# Patient Record
Sex: Male | Born: 1965
Health system: Southern US, Community
[De-identification: ages and names within clinical notes are randomized; demographics above are authoritative.]

## PROBLEM LIST (undated history)

## (undated) HISTORY — PX: NO PAST SURGERIES: SHX2092

---

## 2000-05-29 ENCOUNTER — Emergency Department (HOSPITAL_COMMUNITY): Admission: EM | Admit: 2000-05-29 | Discharge: 2000-05-29 | Payer: Self-pay | Admitting: Emergency Medicine

## 2000-06-02 ENCOUNTER — Encounter: Payer: Self-pay | Admitting: Otolaryngology

## 2000-06-02 ENCOUNTER — Encounter: Admission: RE | Admit: 2000-06-02 | Discharge: 2000-06-02 | Payer: Self-pay | Admitting: Otolaryngology

## 2003-10-29 ENCOUNTER — Inpatient Hospital Stay (HOSPITAL_COMMUNITY): Admission: AD | Admit: 2003-10-29 | Discharge: 2003-10-30 | Payer: Self-pay | Admitting: Otolaryngology

## 2011-06-06 ENCOUNTER — Emergency Department (HOSPITAL_COMMUNITY)
Admission: EM | Admit: 2011-06-06 | Discharge: 2011-06-06 | Disposition: A | Discharge status: Home / Self Care | Payer: Self-pay | Attending: Emergency Medicine | Admitting: Emergency Medicine

## 2011-06-06 ENCOUNTER — Emergency Department (HOSPITAL_COMMUNITY): Payer: Self-pay

## 2011-06-06 DIAGNOSIS — M7989 Other specified soft tissue disorders: Secondary | ICD-10-CM | POA: Insufficient documentation

## 2011-06-06 DIAGNOSIS — Y9269 Other specified industrial and construction area as the place of occurrence of the external cause: Secondary | ICD-10-CM | POA: Insufficient documentation

## 2011-06-06 DIAGNOSIS — W319XXA Contact with unspecified machinery, initial encounter: Secondary | ICD-10-CM | POA: Insufficient documentation

## 2011-06-06 DIAGNOSIS — S62309A Unspecified fracture of unspecified metacarpal bone, initial encounter for closed fracture: Secondary | ICD-10-CM | POA: Insufficient documentation

## 2011-06-06 DIAGNOSIS — Y990 Civilian activity done for income or pay: Secondary | ICD-10-CM | POA: Insufficient documentation

## 2013-03-09 IMAGING — CR DG HAND COMPLETE 3+V*R*
3 series · 3 of 3 positions shown · non-contrast
Comparison: None.

CLINICAL DATA: Right hand injury with pain.

RIGHT HAND - COMPLETE 3+ VIEW

[x hand pa left]
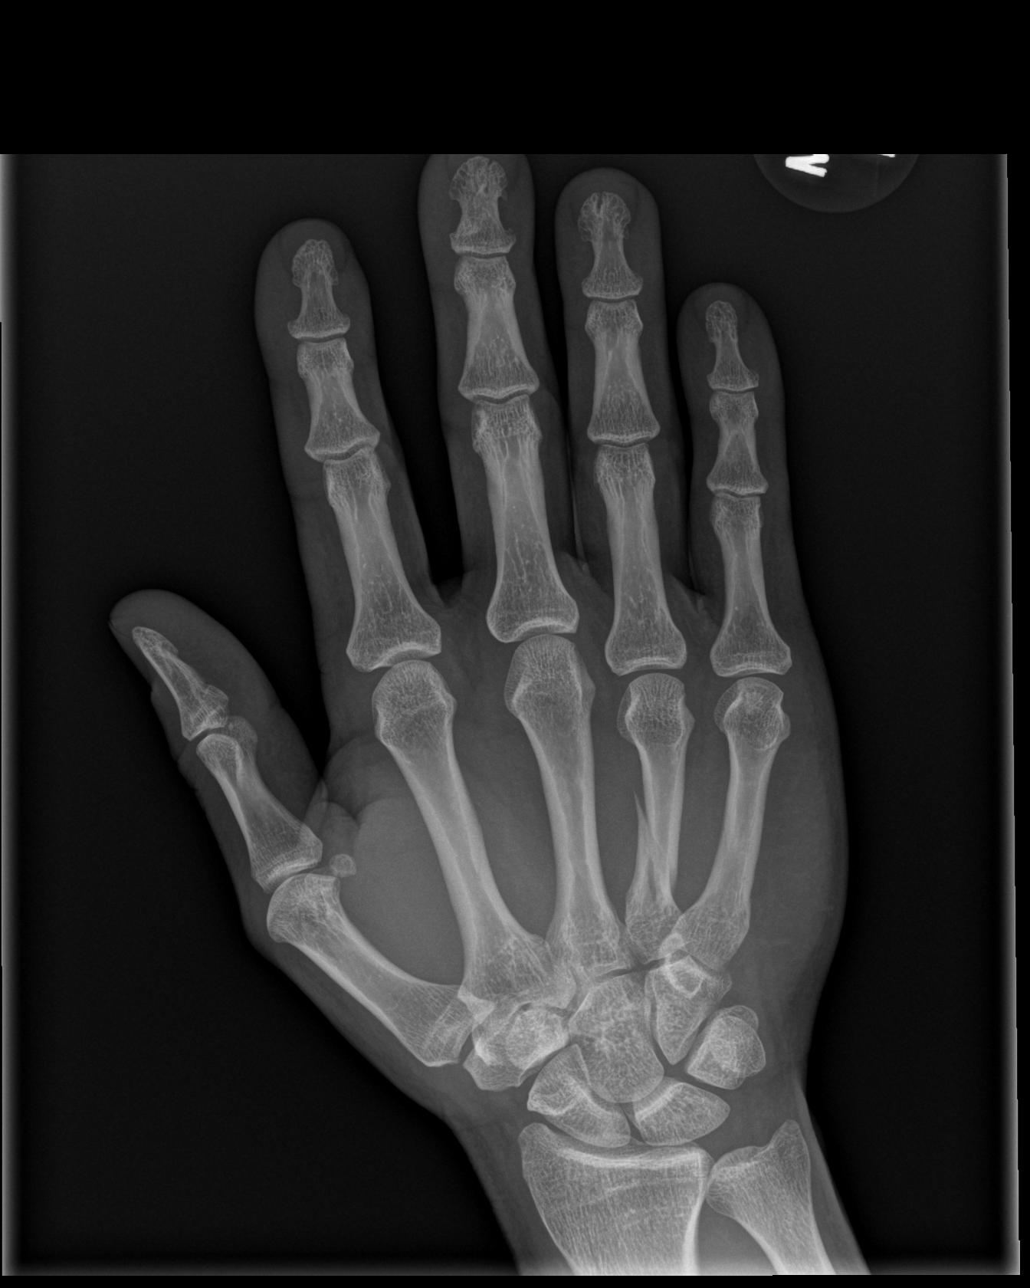

[x hand obl left]
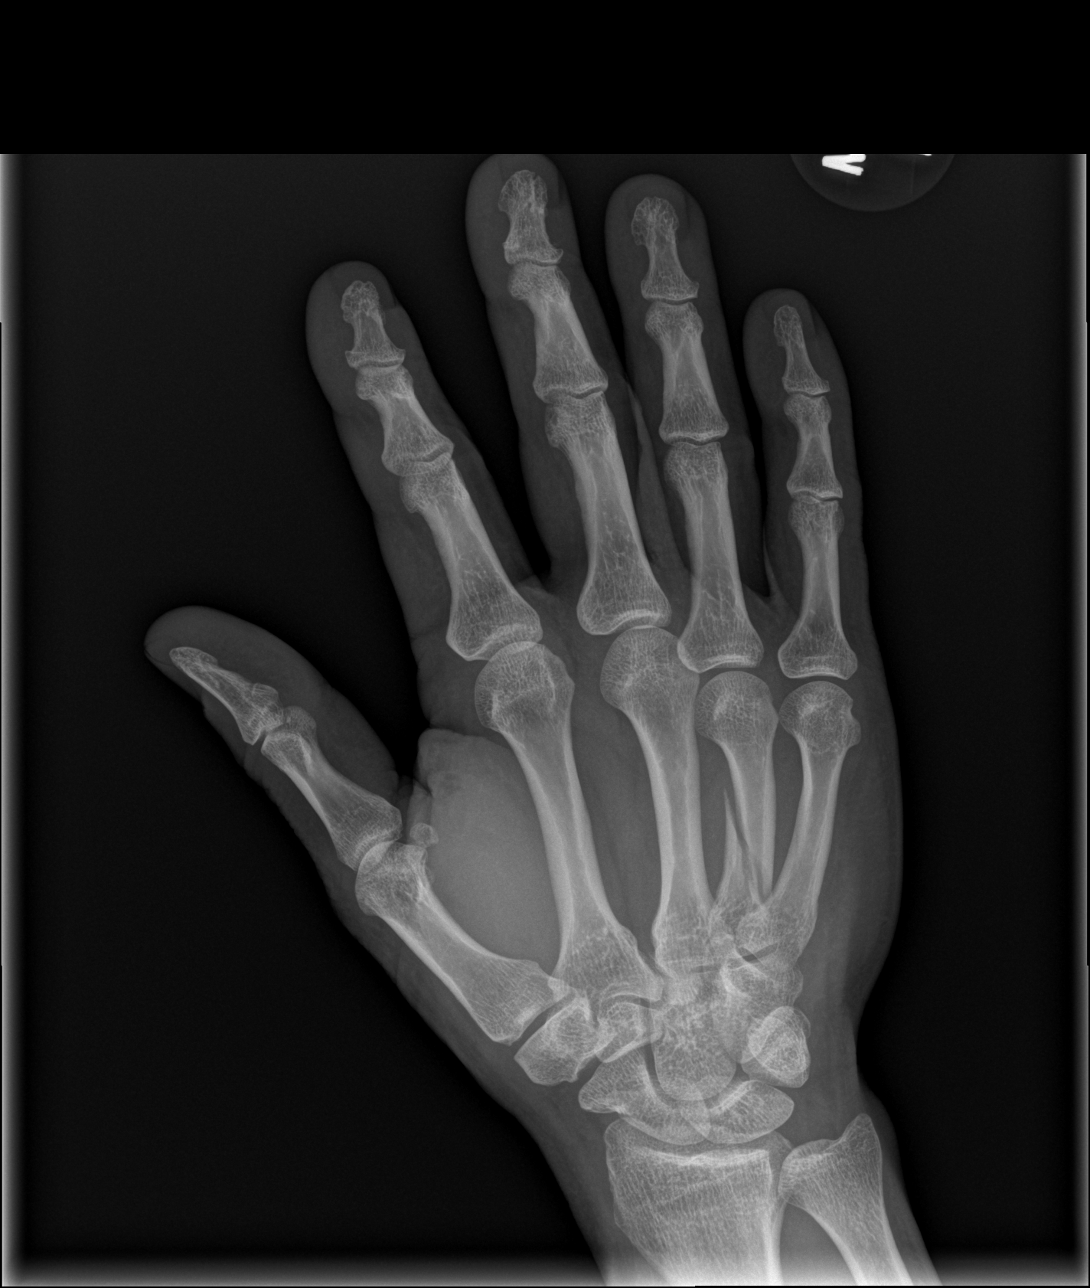

[x hand lat left]
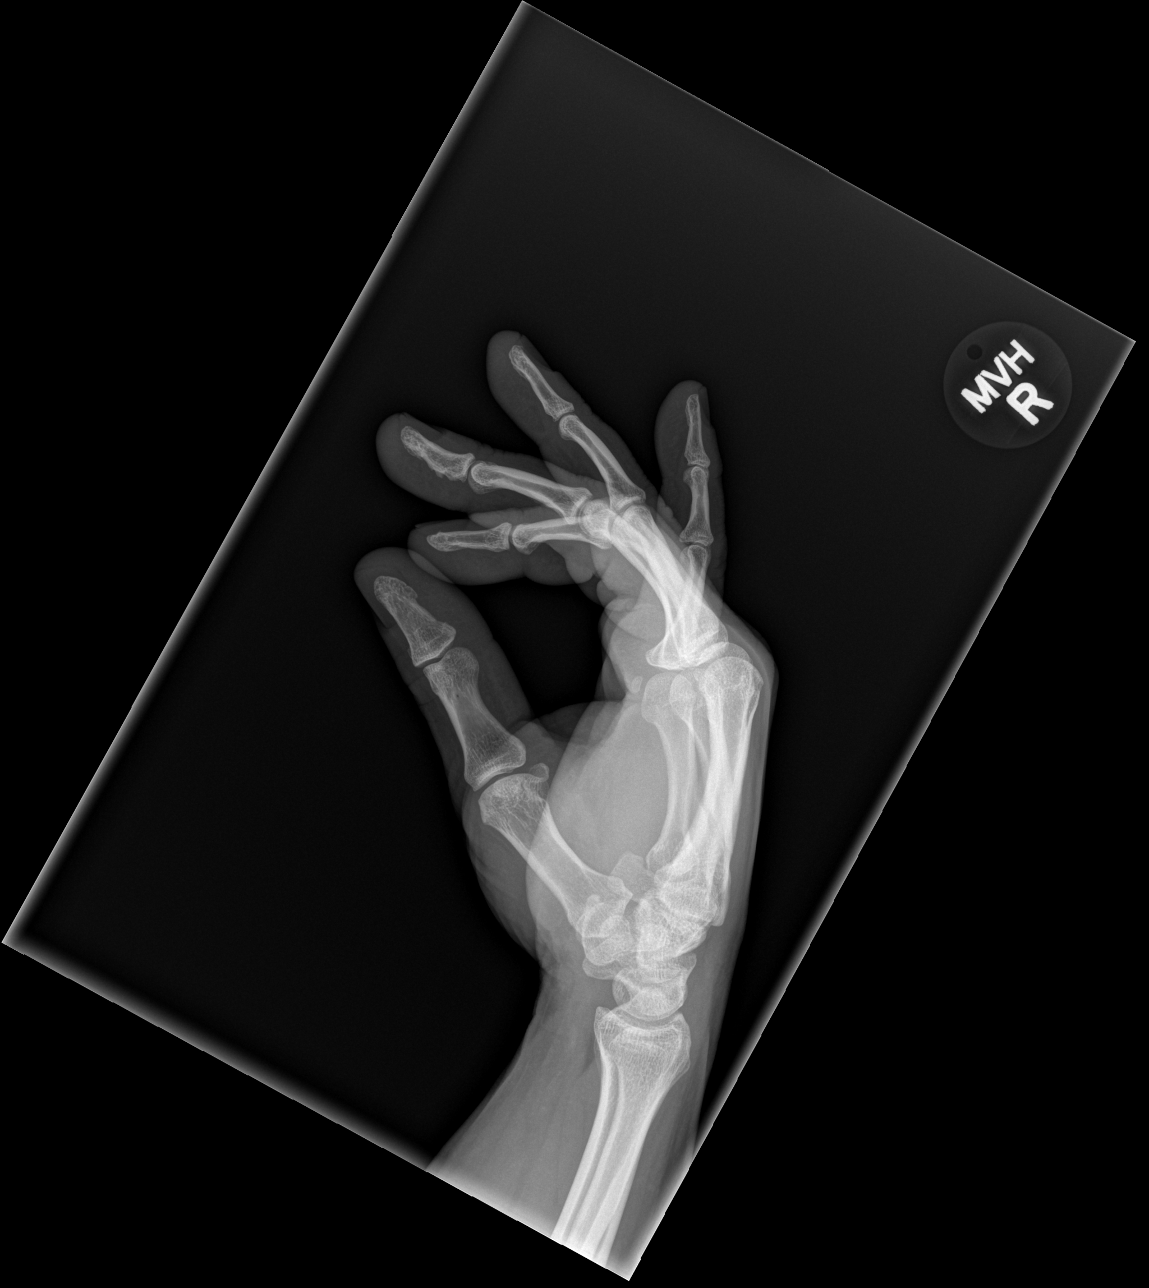

[3 of 3 positions shown; findings below may reference images not displayed]

FINDINGS: There is an oblique fracture through the mid aspect of
the fourth metacarpal demonstrating mild displacement.  No evidence
of dislocation.  No other fractures identified.
IMPRESSION: Mildly displaced fourth metacarpal fracture.

## 2020-11-06 ENCOUNTER — Encounter: Payer: Self-pay | Admitting: *Deleted

## 2020-11-07 NOTE — Congregational Nurse Program (Signed)
  Dept: [place] Nurse Program Note  Date of Encounter: 11/06/2020  Past Medical History: No past medical history on file.  Encounter Details:  CNP Questionnaire - 11/06/20 1535       Questionnaire   Do you give verbal consent to treat you today? Yes    Visit Setting Home    Location Patient Served At [facility] : [facility]    Patient Status Not Applicable    Medical Provider No    Insurance Uninsured (Includes [facility]/Care [place])    Intervention Assess (including screenings);Educate;Counsel;Support    Transportation Need transportation assistance    Medication Have medication insecurities    Referrals Urgent Care    ED Visit Averted Yes    Life-Saving Intervention Made Yes          This CN met with client and his mother in his mother's home.  Client requested assistance with orange card application.  Orange card application reviewed with client and completion started.  He relates that he has been binge drinking alcohol for the past several days.  He has not been to see an MD for several years.  He reports a history of HTN in the past.  He is shaking, mildly agitated and is sweating on his face and forehead.  He reports chronic sinus infections and anxiety.  He reports that he is "hung over".  He has been self medicating with ASA.  His first BP was 208/118 on the left arm and 232/128 on the right arm with a portable cuff.  BP retaken with electronic BP cuff device with similar readings.  Call placed to [place] clinic nurse.  Advised to get patient to urgent care right away.  Discussed findings with mom and client.  Mom agreed to take client to urgent care immediately for treatment of elevated BP.  Will follow up as needed.  Karene Fry, RN, MSN, [place] Office [phone] Cell

## 2020-12-03 ENCOUNTER — Other Ambulatory Visit: Payer: Self-pay

## 2020-12-03 ENCOUNTER — Encounter: Payer: Self-pay | Admitting: Internal Medicine

## 2020-12-03 ENCOUNTER — Other Ambulatory Visit: Payer: Self-pay | Admitting: Internal Medicine

## 2020-12-03 ENCOUNTER — Ambulatory Visit: Payer: Self-pay | Attending: Internal Medicine | Admitting: Internal Medicine

## 2020-12-03 VITALS — BP 170/110 | HR 86 | Resp 16 | Ht 68.0 in | Wt 188.4 lb

## 2020-12-03 DIAGNOSIS — L989 Disorder of the skin and subcutaneous tissue, unspecified: Secondary | ICD-10-CM | POA: Insufficient documentation

## 2020-12-03 DIAGNOSIS — F411 Generalized anxiety disorder: Secondary | ICD-10-CM

## 2020-12-03 DIAGNOSIS — Z114 Encounter for screening for human immunodeficiency virus [HIV]: Secondary | ICD-10-CM

## 2020-12-03 DIAGNOSIS — F17220 Nicotine dependence, chewing tobacco, uncomplicated: Secondary | ICD-10-CM

## 2020-12-03 DIAGNOSIS — I10 Essential (primary) hypertension: Secondary | ICD-10-CM

## 2020-12-03 DIAGNOSIS — F17200 Nicotine dependence, unspecified, uncomplicated: Secondary | ICD-10-CM

## 2020-12-03 DIAGNOSIS — D170 Benign lipomatous neoplasm of skin and subcutaneous tissue of head, face and neck: Secondary | ICD-10-CM | POA: Insufficient documentation

## 2020-12-03 DIAGNOSIS — F410 Panic disorder [episodic paroxysmal anxiety] without agoraphobia: Secondary | ICD-10-CM | POA: Insufficient documentation

## 2020-12-03 DIAGNOSIS — Z2821 Immunization not carried out because of patient refusal: Secondary | ICD-10-CM | POA: Insufficient documentation

## 2020-12-03 DIAGNOSIS — Z1159 Encounter for screening for other viral diseases: Secondary | ICD-10-CM

## 2020-12-03 DIAGNOSIS — F1020 Alcohol dependence, uncomplicated: Secondary | ICD-10-CM | POA: Insufficient documentation

## 2020-12-03 DIAGNOSIS — Z7689 Persons encountering health services in other specified circumstances: Secondary | ICD-10-CM

## 2020-12-03 MED ORDER — BUSPIRONE HCL 5 MG PO TABS
5.0000 mg | ORAL_TABLET | Freq: Three times a day (TID) | ORAL | 5 refills | Status: DC
Start: 2020-12-03 — End: 2021-06-17

## 2020-12-03 MED ORDER — LISINOPRIL-HYDROCHLOROTHIAZIDE 10-12.5 MG PO TABS: 1.0000 | ORAL_TABLET | Freq: Every day | ORAL | 1 refills | Status: DC

## 2020-12-03 MED ORDER — AMLODIPINE BESYLATE 5 MG PO TABS: 5.0000 mg | ORAL_TABLET | Freq: Every day | ORAL | 1 refills | Status: DC

## 2020-12-03 MED FILL — LISINOPRIL-HCTZ 10-12.5 MG: 10-12.5 | 30 days supply | Qty: 30 | Fill #0

## 2020-12-03 MED FILL — busPIRone HCL 5 MG TABS: 5 | 20 days supply | Qty: 60 | Fill #0

## 2020-12-03 MED FILL — AMLODIPINE BESYLATE 5 MG TA: 5 | 30 days supply | Qty: 30 | Fill #0

## 2020-12-03 NOTE — Progress Notes (Signed)
Patient ID: Cesar Hall, male    DOB: Dec 19, 1965  MRN: [id number]  CC: New Patient (Initial Visit) and Hypertension   Subjective: Cesar Hall is a 55 y.o. male who presents for new pt visit His concerns today include:  Patient with history of HTN, binge drinking  No previous PCP in 30 yrs. Patient was seen by congregational nurse on 11/07/2020 to get help with applying for the orange card.  His blood pressure was found to be elevated at 235/125.  She advised him to be seen at urgent care.  Seen and started on lisinopril/HCTZ 10/12.5 mg which she has been taking daily since then.  BP has come down.but still not where it needs to be. -checks BP a few times a wk.  Range 160-170/110 -Reports that he used to have HA and dizziness before BP med started but not anymore; no CP/SOB/LE -advised to lower salt, exercise and slow down on drinking -he has tried to limit to salt in his food  -Alcohol use disorder:  "I drink a lot; I'm a binge drinker; sometimes I drink 2-3 days in a row, sometimes just 1 day"  Drinks a 12 pk beer at a time.  Stops when he runs out of money.  Drank most of his life but heavy for past 10 yrs.  Has not worked in past 10 yrs due to lack of transportation.  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  He has loss his license due to having about 10 DWI. Went to 3-4 treatment programs and many AA meetings most of which were mandated by the court. Quit for 7 mths in his 74's.  Not ready to quit now.  "I wish I can quit but I like it too much."  Tob dep: He smokes cigarette but is a light smoker.  He states that in the past 6 months he is probably only smoked about 2 packs of cigarettes.  He prefers snuff.  "When I drink I dip a lot of snuff."  Uses snuff since teenager. "I love to do it and I'm addicted."   Smokes marijuana occasionally.  Last used 6 mths ago. No hard drugs in past 12 yrs.  Anxiety disorder: Reports history of anxiety disorder for which he was seeing a  psychiatrist in the past and was on clonazepam.  Currently he has been taking his girlfriends BuSpar 5 mg twice a day for the past year.  He finds it helpful in decreasing anxiety during the day and helps him sleep at nights.  Reports increased anxiety when around a crowd or new people and in other situations.  Admits that sometimes his anxiety may be due to alcohol withdrawal also.  Past surgical, family history, social history reviewed and updated.  Patient Active Problem List   Diagnosis Date Noted  . Influenza vaccine refused 12/03/2020  . Tetanus, diphtheria, and acellular pertussis (Tdap) vaccination declined 12/03/2020  . Essential hypertension 12/03/2020  . Alcohol use disorder, severe, dependence ([place]) 12/03/2020  . GAD (generalized anxiety disorder) 12/03/2020  . 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 Current smoker on some days 12/03/2020  . Lipoma of neck 12/03/2020  . Skin lesion of back 12/03/2020     No current outpatient medications on file prior to visit.   No current facility-administered medications on file prior to visit.    No Known Allergies  Social History   Socioeconomic History  . Marital status: Single    Spouse name: Not  on file  . Number of children: 0  . Years of education: Not on file  . Highest education level: GED or equivalent  Occupational History  . Not on file  Tobacco Use  . Smoking status: Light Tobacco Smoker    Packs/day: 0.25    Years: 0.50    Pack years: 0.12  . Smokeless tobacco: Current User    Types: Snuff  Vaping Use  . Vaping Use: Never used  Substance and Sexual Activity  . Alcohol use: Yes  . Drug use: Not Currently    Types: Marijuana, Cocaine, Amphetamines  . Sexual activity: Yes  Other Topics Concern  . Not on file  Social History Narrative  . Not on file   Social Determinants of Health   Financial Resource Strain: Not on file  Food Insecurity: Not on file  Transportation  Needs: Not on file  Physical Activity: Not on file  Stress: Not on file  Social Connections: Not on file  Intimate Partner Violence: Not on file    Family History  Adopted: Yes    ROS: Review of Systems  Skin:       He has a small knot on the right side of his neck which he states has been present for many years and has not changed in size.  He has a larger mass on the left side of his neck which he states has been there for about a year.  He has not paid attention to whether it has increased in size.  It is not painful.  He thinks it may be due to him having chronic sinus infection.  There is another mass on the middle part of his back which he states has been present for years and has not changed in size.    PHYSICAL EXAM: BP (!) 170/110   Pulse 86   Resp 16   Ht 5\' 8"  (1.727 m)   Wt 188 lb 6.4 oz (85.5 kg)   SpO2 97%   BMI 28.65 kg/m     Physical Exam General appearance - alert, well appearing, middle-age Caucasian male and in no distress Mental status - normal mood, behavior, speech, dress, motor activity, and thought processes Eyes - pupils equal and reactive, extraocular eye movements intact Nose - normal and patent, no erythema, discharge or polyps Mouth - mucous membranes moist, pharynx normal without lesions Neck -no thyroid enlargement.  He has a pea-sized subcutaneous cyst on the right side of the neck laterally.  It is soft and movable and less than a centimeter in size. Lymphatics -no cervical or axillary lymphadenopathy Chest - clear to auscultation, no wheezes, rales or rhonchi, symmetric air entry Heart - normal rate, regular rhythm, normal S1, S2, no murmurs, rubs, clicks or gallops Breasts: He has mild gynecomastia Extremities - peripheral pulses normal, no pedal edema, no clubbing or cyanosis Skin -at the base of the neck left side he has a firm raised mass that is about 4 x 3 cm in size.  It is nontender to touch. He has another raised irregular mass at the  middle thoracic spine on the right side that is 2.5 x 1.5 cm in size.  It is nontender to touch and not movable.  GAD 7 : Generalized Anxiety Score 12/03/2020  Nervous, Anxious, on Edge 3  Control/stop worrying 3  Worry too much - different things 3  Trouble relaxing 3  Restless 3  Easily annoyed or irritable 0  Afraid - awful might happen 3  Total GAD 7 Score 18    Depression screen PHQ 2/9 12/03/2020  Decreased Interest 0  Down, Depressed, Hopeless 0  PHQ - 2 Score 0    ASSESSMENT AND PLAN: 1. Establishing care with new doctor, encounter for   2. Essential hypertension Reportedly improved on lisinopril/HCTZ but not at goal of 130/80 or lower. Recommend adding amlodipine 5 mg daily. DASH diet discussed and encouraged. Follow-up with clinical pharmacist in 1 week for repeat blood pressure check. - CBC - Comprehensive metabolic panel - Lipid panel - amLODipine (NORVASC) 5 MG tablet; Take 1 tablet (5 mg total) by mouth daily.  Dispense: 90 tablet; Refill: 1 - lisinopril-hydrochlorothiazide (ZESTORETIC) 10-12.5 MG tablet; Take 1 tablet by mouth daily.  Dispense: 90 tablet; Refill: 1  3. Alcohol use disorder, severe, dependence ([place]) Discussed health risks associated with excessive alcohol use including damage to the liver that can lead to cirrhosis, peripheral neuropathy and cardiomyopathy.  Also spoke of the negative social consequences/impacts including him losing his driver's license.  Patient is not ready to quit but will consider and try to cut back on the amount that he drinks.  Went over how much is too much to drink in a day for a male.  4. GAD (generalized anxiety disorder) Advised against taking other persons medications. He acknowledges that some of which he feels may be anxiety may sometimes be withdrawal from alcohol.  He reports benefit from taking BuSpar.  I think it is reasonable for me to prescribe it for him.  He is not interested in referral to behavioral health at  this time. - busPIRone (BUSPAR) 5 MG tablet; Take 1 tablet (5 mg total) by mouth 3 (three) times daily.  Dispense: 60 tablet; Refill: 5  5. 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 Current smoker on some days Discussed health risks associated with smoking and chewing tobacco including various types of cancers, lung disease and cardiovascular events risks. Advised to quit. Patient states that he is not ready or wanting to quit using snuff but feels that he can quit smoking cigarettes.  Encouraged him not to purchase the cigarettes anymore and set a quit date. Follow-up on progress of whether he was able to quit smoking the cigarettes on our next visit in 6 weeks.  5 minutes spent on counseling.  7. Lipoma of neck 8. Skin lesion of back -Once he has been approved for the orange card/cone discount, if he wishes to have these removed we can refer to a surgeon at that time.  For now we will monitor for any changes in size or if he begins to experience any pain.  9. Influenza vaccine refused Recommended.  Patient declined.  10. Tetanus, diphtheria, and acellular pertussis (Tdap) vaccination declined Recommended.  Patient declined.  11. Screening for HIV (human immunodeficiency virus) - HIV Antibody (routine testing w rflx)  12. Need for hepatitis C screening test - Hepatitis C Antibody   Patient was given the opportunity to ask questions.  Patient verbalized understanding of the plan and was able to repeat key elements of the plan.    Requested Prescriptions   Signed Prescriptions Disp Refills  . busPIRone (BUSPAR) 5 MG tablet 60 tablet 5    Sig: Take 1 tablet (5 mg total) by mouth 3 (three) times daily.  Marland Kitchen amLODipine (NORVASC) 5 MG tablet 90 tablet 1    Sig: Take 1 tablet (5 mg total) by mouth daily.  Marland Kitchen lisinopril-hydrochlorothiazide (ZESTORETIC) 10-12.5 MG tablet 90 tablet 1  Sig: Take 1 tablet by mouth daily.    Return in about 6 weeks (around 01/14/2021) for  Give appt with [facility] in 1 wk for BP recheck.  Karle Plumber, MD, FACP

## 2020-12-03 NOTE — Patient Instructions (Signed)
Continue lisinopril/hydrochlorothiazide. We have added another blood pressure medication called amlodipine. You should take both medicines daily. Follow-up with our clinical pharmacist in 1 week for repeat blood pressure check.  Try to cut back on the amount that you drink.

## 2020-12-04 ENCOUNTER — Telehealth: Payer: Self-pay

## 2020-12-04 ENCOUNTER — Encounter: Payer: Self-pay | Admitting: Internal Medicine

## 2020-12-04 DIAGNOSIS — E782 Mixed hyperlipidemia: Secondary | ICD-10-CM | POA: Insufficient documentation

## 2020-12-04 LAB — COMPREHENSIVE METABOLIC PANEL
ALT: 72 IU/L — ABNORMAL HIGH (ref 0–44)
AST: 49 IU/L — ABNORMAL HIGH (ref 0–40)
Albumin/Globulin Ratio: 1.2 (ref 1.2–2.2)
Albumin: 4.6 g/dL (ref 3.8–4.9)
Alkaline Phosphatase: 72 IU/L (ref 44–121)
BUN/Creatinine Ratio: 18 (ref 9–20)
BUN: 17 mg/dL (ref 6–24)
Bilirubin Total: 0.4 mg/dL (ref 0.0–1.2)
CO2: 24 mmol/L (ref 20–29)
Calcium: 9.6 mg/dL (ref 8.7–10.2)
Chloride: 100 mmol/L (ref 96–106)
Creatinine, Ser: 0.92 mg/dL (ref 0.76–1.27)
Globulin, Total: 3.8 g/dL (ref 1.5–4.5)
Glucose: 108 mg/dL — ABNORMAL HIGH (ref 65–99)
Potassium: 4.8 mmol/L (ref 3.5–5.2)
Sodium: 141 mmol/L (ref 134–144)
Total Protein: 8.4 g/dL (ref 6.0–8.5)
eGFR: 100 mL/min/{1.73_m2} (ref >59)

## 2020-12-04 LAB — CBC
Hematocrit: 46.7 % (ref 37.5–51.0)
Hemoglobin: 15.4 g/dL (ref 13.0–17.7)
MCH: 30.3 pg (ref 26.6–33.0)
MCHC: 33 g/dL (ref 31.5–35.7)
MCV: 92 fL (ref 79–97)
Platelets: 239 10*3/uL (ref 150–450)
RBC: 5.09 x10E6/uL (ref 4.14–5.80)
RDW: 12.5 % (ref 11.6–15.4)
WBC: 9.3 10*3/uL (ref 3.4–10.8)

## 2020-12-04 LAB — LIPID PANEL
Chol/HDL Ratio: 3.7 ratio (ref 0.0–5.0)
Cholesterol, Total: 245 mg/dL — ABNORMAL HIGH (ref 100–199)
HDL: 66 mg/dL (ref >39)
LDL Chol Calc (NIH): 161 mg/dL — ABNORMAL HIGH (ref 0–99)
Triglycerides: 105 mg/dL (ref 0–149)
VLDL Cholesterol Cal: 18 mg/dL (ref 5–40)

## 2020-12-04 LAB — HEPATITIS C ANTIBODY: Hep C Virus Ab: <0.1 s/co ratio (ref 0.0–0.9)

## 2020-12-04 LAB — HIV ANTIBODY (ROUTINE TESTING W REFLEX): HIV Screen 4th Generation wRfx: NONREACTIVE

## 2020-12-04 NOTE — Progress Notes (Signed)
Let patient know that his blood cell counts including red blood cell, white blood cell and platelet counts are normal.  Kidney function normal.  Two of his liver function tests are abnormally elevated.  This is most likely due to ongoing excessive alcohol use.  We discussed on his recent visit the importance of cutting back on the amount that he drinks or better yet quitting completely to prevent irreversible damage to the liver and other body systems.  His cholesterol levels are elevated.  His LDL cholesterol is 161 with goal being less than 100.  High cholesterol can increase his risk for heart attack and strokes.  Healthy eating habits and regular exercise will help to lower cholesterol.  Ideally he would benefit from being on cholesterol-lowering medication but we will hold off at this time until his liver function tests have normalized.  Screening tests for hepatitis C and HIV were negative. The rest of this is for my information. The 10-year ASCVD risk score Mikey Bussing DC Brooke Bonito., et al., 2013) is: 16.1%   Values used to calculate the score:     Age: 55 years     Sex: Male     Is Non-Hispanic African American: No     Diabetic: No     Tobacco smoker: Yes     Systolic Blood Pressure: 677 mmHg     Is BP treated: Yes     HDL Cholesterol: 66 mg/dL     Total Cholesterol: 245 mg/dL

## 2020-12-04 NOTE — Telephone Encounter (Signed)
Contacted pt to go over lab results phone number listed for pt is the wrong number. Tried contacting his mother lvm

## 2020-12-06 ENCOUNTER — Telehealth: Payer: Self-pay | Admitting: *Deleted

## 2020-12-06 NOTE — Telephone Encounter (Signed)
Received call from client stating that he had completed his appointment with [facility] and that he was happy with his progress.  He tells me that he is taking a new BP medication as well as the one prescribed last month at urgent care.  He is also taking the Buspar as prescribed.  I did go over the directions on his prescriptions with him.  Client will follow up with this CN as needed.  Karene Fry, RN, MSN, [place] Office [phone] Cell

## 2020-12-17 ENCOUNTER — Ambulatory Visit: Payer: Self-pay | Attending: Internal Medicine | Admitting: Pharmacist

## 2020-12-17 ENCOUNTER — Other Ambulatory Visit: Payer: Self-pay

## 2020-12-17 VITALS — BP 132/91 | HR 88

## 2020-12-17 DIAGNOSIS — I10 Essential (primary) hypertension: Secondary | ICD-10-CM

## 2020-12-17 NOTE — Progress Notes (Signed)
   S:    Patient arrives well and in good spirits.    Presents to the clinic for hypertension evaluation, counseling, and management.  Patient was last seen by Primary Care Provider, Dr. Wynetta Emery, for an establishment of care visit on 12/03/2020. At this visit, BP was elevated at 170/110 - amlodipine 5 mg was started.   Of note, patient has been without PCP for approximately 30 years. He presented to urgent care in February with a BP of 235/125 - lisinopril/hctz was started. He has a history of significant daily alcohol intake.  Today, patient reports great adherence to blood pressure medications. Denies side effects. Denies headaches or dizziness. He endorses significant concerns for his health after being told his liver enzymes are elevated. Historically, he was drinking at least 15 beers a day. Over the past week, he has significantly reduced his alcohol intake. He reports he has drank 3 out of the past 8 days, drinking 4-5 drinks within each day. He is very acknowledging of the changes he needs to make to better his health, and states this has been a "wake up call".  Current BP Medications include:  Lisinopril/hctz 10 mg/12.5 mg daily, amlodipine 5 mg daily   Dietary habits include: salty foods, sodas, alcohol  Exercise habits include: none reported  O:  Physical Exam  ROS  Home BP readings: subjectively reported systolic reading in the [id number]  Last 3 Office BP readings: BP Readings from Last 3 Encounters:  12/17/20 (!) 132/91  12/03/20 (!) 170/110  11/06/20 (!) 235/125    BMET    Component Value Date/Time   NA 141 12/03/2020 1054   K 4.8 12/03/2020 1054   CL 100 12/03/2020 1054   CO2 24 12/03/2020 1054   GLUCOSE 108 (H) 12/03/2020 1054   BUN 17 12/03/2020 1054   CREATININE 0.92 12/03/2020 1054   CALCIUM 9.6 12/03/2020 1054    Renal function: CrCl cannot be calculated (Unknown ideal weight.).  Clinical ASCVD: No  The 10-year ASCVD risk score Mikey Bussing DC Jr., et al.,  2013) is: 10.6%   Values used to calculate the score:     Age: 55 years     Sex: Male     Is Non-Hispanic African American: No     Diabetic: No     Tobacco smoker: Yes     Systolic Blood Pressure: 263 mmHg     Is BP treated: Yes     HDL Cholesterol: 66 mg/dL     Total Cholesterol: 245 mg/dL    A/P: Hypertension diagnosed currently uncontrolled on current medications. BP Goal = < 130/80 mmHg. Medication adherence reported appropriate. Patient is making great steps to a healthier lifestyle. Blood pressure is close to goal today - will continue current regimen and follow up in ~3 weeks. Consider increasing amlodipine to 10 mg at this visit if BP remains not at goal. Of note, had a lengthy discussion regarding liver enzyme lab results today.  -Continued lisinopril/hctz 10/12.5 mg once daily. -Continued amlodipine 5 mg once daily.  -Counseled on lifestyle modifications for blood pressure control including reduced dietary sodium, increased exercise, adequate sleep.  Results reviewed and written information provided.   Total time in face-to-face counseling 20 minutes.   F/U Clinic Pharmacy visit in 3 weeks.  Patient seen with Othar Curto.  Harriet Pho, PharmD PGY-1 [facility] Pharmacy Resident   12/17/2020 4:24 PM

## 2020-12-18 ENCOUNTER — Telehealth: Payer: Self-pay | Admitting: *Deleted

## 2020-12-18 NOTE — Telephone Encounter (Signed)
This CN received a call from client with update from visit to pharmacist yesterday.  Client reports better BP control with 132/91 results from 3/22.  We discussed his continued work toward his orange card application.  He will follow up with me once he is ready to submit orange card application.  Karene Fry, RN, MSN, [place] Office [phone] Cell

## 2021-01-02 ENCOUNTER — Other Ambulatory Visit: Payer: Self-pay

## 2021-01-02 MED FILL — Amlodipine Besylate Tab 5 MG (Base Equivalent): ORAL | 30 days supply | Qty: 30 | Fill #0 | Status: AC

## 2021-01-02 MED FILL — Buspirone HCl Tab 5 MG: ORAL | 30 days supply | Qty: 60 | Fill #0 | Status: AC

## 2021-01-02 MED FILL — Lisinopril & Hydrochlorothiazide Tab 10-12.5 MG: ORAL | 30 days supply | Qty: 30 | Fill #0 | Status: AC

## 2021-01-03 ENCOUNTER — Other Ambulatory Visit: Payer: Self-pay

## 2021-01-14 ENCOUNTER — Encounter: Payer: Self-pay | Admitting: Pharmacist

## 2021-01-14 ENCOUNTER — Other Ambulatory Visit: Payer: Self-pay

## 2021-01-14 ENCOUNTER — Ambulatory Visit: Payer: Self-pay | Attending: Internal Medicine | Admitting: Pharmacist

## 2021-01-14 VITALS — BP 132/90 | HR 96

## 2021-01-14 DIAGNOSIS — I10 Essential (primary) hypertension: Secondary | ICD-10-CM

## 2021-01-14 MED ORDER — AMLODIPINE BESYLATE 10 MG PO TABS
10.0000 mg | ORAL_TABLET | Freq: Every day | ORAL | 2 refills | Status: DC
  Filled 2021-01-14: qty 30, 30d supply, fill #0
  Filled 2021-02-21: qty 30, 30d supply, fill #1
  Filled 2021-03-27: qty 30, 30d supply, fill #2

## 2021-01-14 NOTE — Progress Notes (Signed)
   S:    Patient arrives well and in good spirits. Presents to the clinic for hypertension evaluation, counseling, and management. Patient was last seen by Primary Care Provider on 12/03/2020. We saw him on 12/17/20 and BP revealed improvement. We held off on any changes.   Today, patient reports great adherence to blood pressure medications. Denies side effects. Denies headaches or dizziness.   Current BP Medications include:  Lisinopril/hctz 10 mg/12.5 mg daily, amlodipine 5 mg daily   Fhx: no pertinent positives  Dietary habits include: salty foods, sodas, alcohol  Exercise habits include: none reported  O:  Vitals:   01/14/21 1547  BP: 132/90  Pulse: 96   Home BP readings: subjectively reported systolic reading in the [id number]  Last 3 Office BP readings: BP Readings from Last 3 Encounters:  01/14/21 132/90  12/17/20 (!) 132/91  12/03/20 (!) 170/110    BMET    Component Value Date/Time   NA 141 12/03/2020 1054   K 4.8 12/03/2020 1054   CL 100 12/03/2020 1054   CO2 24 12/03/2020 1054   GLUCOSE 108 (H) 12/03/2020 1054   BUN 17 12/03/2020 1054   CREATININE 0.92 12/03/2020 1054   CALCIUM 9.6 12/03/2020 1054    Renal function: CrCl cannot be calculated (Patient's most recent lab result is older than the maximum 21 days allowed.).  Clinical ASCVD: No  The 10-year ASCVD risk score Mikey Bussing DC Jr., et al., 2013) is: 10.6%   Values used to calculate the score:     Age: 55 years     Sex: Male     Is Non-Hispanic African American: No     Diabetic: No     Tobacco smoker: Yes     Systolic Blood Pressure: 462 mmHg     Is BP treated: Yes     HDL Cholesterol: 66 mg/dL     Total Cholesterol: 245 mg/dL    A/P: Hypertension longstanding currently above goal on current medications. BP Goal = < 130/80 mmHg. Medication adherence reported.  -Continued lisinopril/hctz 10/12.5 mg once daily. -Increased amlodipine to 10 mg once daily.  -Counseled on lifestyle modifications for blood  pressure control including reduced dietary sodium, increased exercise, adequate sleep.  Results reviewed and written information provided.   Total time in face-to-face counseling 20 minutes.   F/U PCP visit next month.   Benard Halsted, PharmD, [place], [place] [phone]

## 2021-01-27 ENCOUNTER — Other Ambulatory Visit: Payer: Self-pay

## 2021-01-27 ENCOUNTER — Ambulatory Visit: Payer: Self-pay | Attending: Internal Medicine | Admitting: Internal Medicine

## 2021-01-27 ENCOUNTER — Encounter: Payer: Self-pay | Admitting: Internal Medicine

## 2021-01-27 VITALS — BP 108/74 | HR 102 | Resp 16 | Wt 190.6 lb

## 2021-01-27 DIAGNOSIS — F411 Generalized anxiety disorder: Secondary | ICD-10-CM

## 2021-01-27 DIAGNOSIS — F1020 Alcohol dependence, uncomplicated: Secondary | ICD-10-CM

## 2021-01-27 DIAGNOSIS — F17220 Nicotine dependence, chewing tobacco, uncomplicated: Secondary | ICD-10-CM

## 2021-01-27 DIAGNOSIS — I10 Essential (primary) hypertension: Secondary | ICD-10-CM

## 2021-01-27 DIAGNOSIS — E782 Mixed hyperlipidemia: Secondary | ICD-10-CM

## 2021-01-27 MED FILL — Lisinopril & Hydrochlorothiazide Tab 10-12.5 MG: ORAL | 30 days supply | Qty: 30 | Fill #1 | Status: AC

## 2021-01-27 NOTE — Progress Notes (Signed)
Patient ID: Cesar Hall, male    DOB: May 06, 1966  MRN: [id number]  CC: Hypertension   Subjective: Cesar Hall is a 55 y.o. male who presents for chronic ds management.  Last seen 11/2020. His concerns today include:  Patient with history of HTN, binge drinking, tob dep, anx ds  Anx ds:  Doing well on Buspar  HTN:  Compliant with Lis/HCTZ and Norvasc.  Has a device but not checking BP No CP/SOB LDL was elev 161 on lipid profile.  His ASCVD score is 16%.  However I held off on starting statin therapy because he had mild elevation in his LFTs likely due to EtOH abuse.  ETOH abuse: cut back a little but inconsistent. Not wanting to do any treatment program because he is not completely ready to quit.  Tob dep: recently smoking 1-2 cigarettes/day "when I run out of dip."  He awaits approval for the orange card.  States he was told he was approved but has not received the card in the mail as yet.  We had plan to refer him to dermatology or general surgeon for the hornlike lesion on the posterior thorax noted on last visit.  He states this has not changed since he last saw me.  Patient Active Problem List   Diagnosis Date Noted  . Mixed hyperlipidemia 12/04/2020  . Influenza vaccine refused 12/03/2020  . Tetanus, diphtheria, and acellular pertussis (Tdap) vaccination declined 12/03/2020  . Essential hypertension 12/03/2020  . Alcohol use disorder, severe, dependence ([place]) 12/03/2020  . GAD (generalized anxiety disorder) 12/03/2020  . 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 Current smoker on some days 12/03/2020  . Lipoma of neck 12/03/2020  . Skin lesion of back 12/03/2020     Current Outpatient Medications on File Prior to Visit  Medication Sig Dispense Refill  . amLODipine (NORVASC) 10 MG tablet Take 1 tablet (10 mg total) by mouth daily. 30 tablet 2  . busPIRone (BUSPAR) 5 MG tablet Take 1 tablet (5 mg total) by mouth 3 (three) times daily.  60 tablet 5  . busPIRone (BUSPAR) 5 MG tablet TAKE 1 TABLET (5 MG TOTAL) BY MOUTH 3 (THREE) TIMES DAILY. 60 tablet 5  . lisinopril-hydrochlorothiazide (ZESTORETIC) 10-12.5 MG tablet Take 1 tablet by mouth daily. 90 tablet 1  . lisinopril-hydrochlorothiazide (ZESTORETIC) 10-12.5 MG tablet TAKE 1 TABLET BY MOUTH DAILY. 90 tablet 1   No current facility-administered medications on file prior to visit.    No Known Allergies  Social History   Socioeconomic History  . Marital status: Single    Spouse name: Not on file  . Number of children: 0  . Years of education: Not on file  . Highest education level: GED or equivalent  Occupational History  . Not on file  Tobacco Use  . Smoking status: Light Tobacco Smoker    Packs/day: 0.25    Years: 0.50    Pack years: 0.12  . Smokeless tobacco: Current User    Types: Snuff  Vaping Use  . Vaping Use: Never used  Substance and Sexual Activity  . Alcohol use: Yes  . Drug use: Not Currently    Types: Marijuana, Cocaine, Amphetamines  . Sexual activity: Yes  Other Topics Concern  . Not on file  Social History Narrative  . Not on file   Social Determinants of Health   Financial Resource Strain: Not on file  Food Insecurity: Not on file  Transportation Needs: Not on file  Physical Activity:  Not on file  Stress: Not on file  Social Connections: Not on file  Intimate Partner Violence: Not on file    Family History  Adopted: Yes    Past Surgical History:  Procedure Laterality Date  . NO PAST SURGERIES      ROS: Review of Systems Negative except as stated above  PHYSICAL EXAM: BP 108/74   Pulse (!) 102   Resp 16   Wt 190 lb 9.6 oz (86.5 kg)   SpO2 98%   BMI 28.98 kg/m   Physical Exam General appearance - alert, well appearing, middle-age Caucasian male and in no distress Mental status - normal mood, behavior, speech, dress, motor activity, and thought processes Neck - supple, no significant adenopathy Chest - clear to  auscultation, no wheezes, rales or rhonchi, symmetric air entry Heart - normal rate, regular rhythm, normal S1, S2, no murmurs, rubs, clicks or gallops  CMP Latest Ref Rng & Units 12/03/2020  Glucose 65 - 99 mg/dL 108(H)  BUN 6 - 24 mg/dL 17  Creatinine 0.76 - 1.27 mg/dL 0.92  Sodium 134 - 144 mmol/L 141  Potassium 3.5 - 5.2 mmol/L 4.8  Chloride 96 - 106 mmol/L 100  CO2 20 - 29 mmol/L 24  Calcium 8.7 - 10.2 mg/dL 9.6  Total Protein 6.0 - 8.5 g/dL 8.4  Total Bilirubin 0.0 - 1.2 mg/dL 0.4  Alkaline Phos 44 - 121 IU/L 72  AST 0 - 40 IU/L 49(H)  ALT 0 - 44 IU/L 72(H)   Lipid Panel     Component Value Date/Time   CHOL 245 (H) 12/03/2020 1054   TRIG 105 12/03/2020 1054   HDL 66 12/03/2020 1054   CHOLHDL 3.7 12/03/2020 1054   LDLCALC 161 (H) 12/03/2020 1054    CBC    Component Value Date/Time   WBC 9.3 12/03/2020 1054   RBC 5.09 12/03/2020 1054   HGB 15.4 12/03/2020 1054   HCT 46.7 12/03/2020 1054   PLT 239 12/03/2020 1054   MCV 92 12/03/2020 1054   MCH 30.3 12/03/2020 1054   MCHC 33.0 12/03/2020 1054   RDW 12.5 12/03/2020 1054    ASSESSMENT AND PLAN: 1. Essential hypertension At goal.  Continue amlodipine and lisinopril/HCTZ  2. Mixed hyperlipidemia Discussed and encourage healthy eating habits and regular exercise as tolerated.  However if he experiences any chest pains with exercise he should stop and let me know.  Ideally he should be on statin therapy but he understands my reasoning for holding off at this time because of his elevated liver enzymes and ongoing alcohol use.  3. Alcohol use disorder, severe, dependence ([place]) Patient not interested in trying medications or therapy.  4. GAD (generalized anxiety disorder) Doing well on BuSpar  5. 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  Still not ready or wanting to quit at this time.   Patient was given the opportunity to ask questions.  Patient verbalized understanding of the plan  and was able to repeat key elements of the plan.   No orders of the defined types were placed in this encounter.    Requested Prescriptions    No prescriptions requested or ordered in this encounter    No follow-ups on file.  Karle Plumber, MD, FACP

## 2021-01-29 ENCOUNTER — Other Ambulatory Visit: Payer: Self-pay

## 2021-01-29 MED ORDER — AMOXICILLIN 500 MG PO TABS: ORAL_TABLET | ORAL | 0 refills | Status: DC

## 2021-01-29 MED ORDER — AMOXICILLIN 500 MG PO CAPS
ORAL_CAPSULE | ORAL | 0 refills | Status: DC
  Filled 2021-01-29: qty 21, 7d supply, fill #0

## 2021-01-29 MED ORDER — IBUPROFEN 800 MG PO TABS
800.0000 mg | ORAL_TABLET | Freq: Three times a day (TID) | ORAL | 1 refills | Status: AC | PRN
  Filled 2021-01-29: qty 15, 5d supply, fill #0
  Filled 2021-03-27: qty 15, 5d supply, fill #1

## 2021-02-03 ENCOUNTER — Ambulatory Visit: Payer: Self-pay

## 2021-02-10 ENCOUNTER — Telehealth: Payer: Self-pay | Admitting: Internal Medicine

## 2021-02-10 ENCOUNTER — Other Ambulatory Visit: Payer: Self-pay

## 2021-02-10 ENCOUNTER — Ambulatory Visit: Payer: Self-pay | Attending: Internal Medicine

## 2021-02-10 DIAGNOSIS — Z741 Need for assistance with personal care: Secondary | ICD-10-CM

## 2021-02-10 DIAGNOSIS — L989 Disorder of the skin and subcutaneous tissue, unspecified: Secondary | ICD-10-CM

## 2021-02-10 DIAGNOSIS — F411 Generalized anxiety disorder: Secondary | ICD-10-CM

## 2021-02-10 MED FILL — Buspirone HCl Tab 5 MG: ORAL | 20 days supply | Qty: 60 | Fill #1 | Status: AC

## 2021-02-10 NOTE — Telephone Encounter (Signed)
Patient came into the office today to request referrals pt has the oc/cafa  1. Dental referral pt states he has 3 teeth pulled and he is having some dental issues  2. Psychiatry referral pt states he was diagnosed with anxiety  3. Dermatology referral pt states he has some cyst on his back and the provider has already seen them.

## 2021-02-21 ENCOUNTER — Other Ambulatory Visit: Payer: Self-pay

## 2021-02-25 ENCOUNTER — Telehealth: Payer: Self-pay | Admitting: Internal Medicine

## 2021-02-25 NOTE — Telephone Encounter (Signed)
Pt was sent a letter from financial dept. Inform them, that the application they submitted was incomplete, since they were missing some documentation at the time of the appointment, Pt need to reschedule and resubmit all new papers and application for CAFA and OC, P.S. old documents has been sent back by mail to the Pt and Pt. need to make a new appt. 

## 2021-02-26 ENCOUNTER — Other Ambulatory Visit: Payer: Self-pay

## 2021-03-06 ENCOUNTER — Other Ambulatory Visit: Payer: Self-pay

## 2021-03-06 MED FILL — Buspirone HCl Tab 5 MG: ORAL | 20 days supply | Qty: 60 | Fill #2 | Status: AC

## 2021-03-06 MED FILL — Lisinopril & Hydrochlorothiazide Tab 10-12.5 MG: ORAL | 30 days supply | Qty: 30 | Fill #2 | Status: AC

## 2021-03-20 ENCOUNTER — Other Ambulatory Visit: Payer: Self-pay

## 2021-03-27 ENCOUNTER — Ambulatory Visit: Payer: Self-pay | Attending: Internal Medicine

## 2021-03-27 ENCOUNTER — Other Ambulatory Visit: Payer: Self-pay

## 2021-03-27 MED FILL — Buspirone HCl Tab 5 MG: ORAL | 20 days supply | Qty: 60 | Fill #3 | Status: AC

## 2021-03-27 MED FILL — Lisinopril & Hydrochlorothiazide Tab 10-12.5 MG: ORAL | 30 days supply | Qty: 30 | Fill #3 | Status: AC

## 2021-04-15 ENCOUNTER — Ambulatory Visit (INDEPENDENT_AMBULATORY_CARE_PROVIDER_SITE_OTHER): Payer: No Payment, Other | Admitting: Licensed Clinical Social Worker

## 2021-04-15 ENCOUNTER — Encounter (HOSPITAL_COMMUNITY): Payer: Self-pay | Admitting: Licensed Clinical Social Worker

## 2021-04-15 ENCOUNTER — Other Ambulatory Visit: Payer: Self-pay

## 2021-04-15 DIAGNOSIS — F1094 Alcohol use, unspecified with alcohol-induced mood disorder: Secondary | ICD-10-CM | POA: Diagnosis not present

## 2021-04-15 NOTE — Progress Notes (Signed)
Comprehensive Clinical Assessment (CCA) Note  04/15/2021 Cesar Hall [id number]  Chief Complaint:  Chief Complaint  Patient presents with   Anxiety   Depression   Addiction Problem    Does not want to cut down score over 20 alcohol screening     Visit Diagnosis: Alcohol Induced mood disorder    Client is a 55 year old male. Client is referred by community [facility] and wellness for a mood disorder.  Client states mental health symptoms as evidenced by:   Depression Change in energy/activity; Difficulty Concentrating; Fatigue; Hopelessness; Irritability; Sleep (too much or little); Worthlessness Change in energy/activity; Difficulty Concentrating; Fatigue; Hopelessness; Irritability; Sleep (too much or little); Worthlessness      Mania Racing thoughts; Increased Energy Racing thoughts; Increased Energy  Anxiety Worrying; Tension; Restlessness; Difficulty concentrating Worrying; Tension; Restlessness; Difficulty concentrating   Client denies suicidal and homicidal ideations currently.  Client denies hallucinations and delusions currently.   Client was screened for the following SDOH: smoking, financials, transportation, exercise, stress/tension, social interaction, depression, alcohol, and housing.    Assessment Information that integrates subjective and objective details with a therapist's professional interpretation:    Pt was alert and oriented x 5. He was dressed casually and engaged well in therapy/intake. Pt presented with flat and anxious mood/affect. He was cooperative and Pleasant in session.   Pt primary reason for intake was medication management. He reports that he has Hx of depression and anxiety. Currently pt drinks about 40 beers per week over 4 to 5 days. He has not drunk since Sunday. LCSW provided brief education of alcohol intake but pt declined quitting at this time. Pt currently takes Buspar 5 mg provided by his PCP.   Pt reports primary stressor is financials.  Only income that pt has is from his girlfriend of 12 years. She makes around 800 per month and their rent is about 650. They do get about 300 each in food stamps as well. [name] is attempting to get disability but is in the very early stages of applying.   Primary support system is his mother. She provides transportation for pt and financial support when she is able. Concern for pt is that she is getting older and if he does not figure out his medicate and disability stuff, he could end up homeless as he cannot support himself independently.    Client meets criteria for: Alcohol induced mood disorder    Client states use of the following substances: Alcohol 4 x per week on average 40 beers.   Therapist addressed (substance use) concern, although client meets criteria, he/ she reports they do not wish to pursue Tx at this time although therapist feels they would benefit from [place] counseling. (IF CLIENT HAS A S/A PROBLEM)   Treatment recommendations are included plan: Continue to take buspar as directed 7/7 days per week and f/u with [facility] team for further med eval in next 60 days.    Client agreed with treatment recommendations.    CCA Screening, Triage and Referral (STR)  Patient Reported Information Referral name: Community Wellness   Whom do you see for routine medical problems? Primary Care  Practice/Facility Name: [facility] Would Help You the Most Today? Treatment for Depression or other mood problem   Have You Recently Been in Any Inpatient Treatment (Hospital/Detox/Crisis Center/28-Day Program)? No   Have You Ever Received Services From [facility] Before? Yes  Who Do You See at [facility]? [facility]  Have You Recently Had Any Thoughts About Hurting Yourself? No  Are You Planning to Commit Suicide/Harm Yourself At This time? No   Have you Recently Had Thoughts About [place]? No   Have You  Used Any Alcohol or Drugs in the Past 24 Hours? Yes  What Did You Use and How Much? 12 pack   Do You Currently Have a Therapist/Psychiatrist? No   Have You Been Recently Discharged From Any Office Practice or Programs? No     CCA Screening Triage Referral Assessment Type of Contact: Face-to-Face  Is CPS involved or ever been involved? Never  Is APS involved or ever been involved? Never   Patient Determined To Be At Risk for Harm To Self or Others Based on Review of Patient Reported Information or Presenting Complaint? No   Location of Assessment: [facility]   Does Patient Present under Involuntary Commitment? No data recorded IVC Papers Initial File Date: No data recorded  [place] of Residence: [place]   Options For Referral: Medication Management     CCA Biopsychosocial Intake/Chief Complaint:  anxiety and depression  Current Symptoms/Problems: worry, pacing floor, restlessness, nervousness, rapid thoughts   Patient Reported Schizophrenia/Schizoaffective Diagnosis in Past: No   Strengths: sig other  Preferences: No data recorded Abilities: fishing   Type of Services Patient Feels are Needed: medication managment   Initial Clinical Notes/Concerns: insomnia   Mental Health Symptoms Depression:   Change in energy/activity; Difficulty Concentrating; Fatigue; Hopelessness; Irritability; Sleep (too much or little); Worthlessness   Duration of Depressive symptoms: No data recorded  Mania:   Racing thoughts; Increased Energy   Anxiety:    Worrying; Tension; Restlessness; Difficulty concentrating   Psychosis:   None   Duration of Psychotic symptoms: No data recorded  Trauma:   N/A   Obsessions:   N/A   Compulsions:   N/A   Inattention:   N/A   Hyperactivity/Impulsivity:   N/A   Oppositional/Defiant Behaviors:   N/A   Emotional Irregularity:   N/A   Other Mood/Personality Symptoms:  No data recorded   Mental Status  Exam Appearance and self-care  Stature:   Average   Weight:   Overweight   Clothing:   Casual   Grooming:   Normal   Cosmetic use:   None   Posture/gait:   Normal   Motor activity:   Restless   Sensorium  Attention:   Normal   Concentration:   Normal   Orientation:   X5   Recall/memory:   Normal   Affect and Mood  Affect:   Anxious   Mood:   Anxious   Relating  Eye contact:   Normal   Facial expression:   Anxious   Attitude toward examiner:   Cooperative   Thought and Language  Speech flow:  Clear and Coherent   Thought content:   Appropriate to Mood and Circumstances   Preoccupation:   None   Hallucinations:   None   Organization:  No data recorded  [employer] of Knowledge:   Fair   Intelligence:   Average   Abstraction:   Functional   Judgement:   Fair   Art therapist:  No data recorded  [employer]:   Fair   Decision Making:   Normal   Social Functioning  Social Maturity:   Irresponsible   Social Judgement:   Heedless   Stress  Stressors:   Financial   Coping Ability:   Overwhelmed   Skill Deficits:  Self-care; Self-control; Decision making   Supports:   Family     Religion: Religion/Spirituality Are You A Religious Person?: No  Leisure/Recreation: Leisure / Recreation Do You Have Hobbies?: Yes Leisure and Hobbies: fishing  Exercise/Diet: Exercise/Diet Do You Exercise?: Yes What Type of Exercise Do You Do?: Bike How Many Times a Week Do You Exercise?: 4-5 times a week Have You Gained or Lost A Significant Amount of Weight in the Past Six Months?: No Do You Follow a Special Diet?: No Do You Have Any Trouble Sleeping?: Yes Explanation of Sleeping Difficulties: insomnia   CCA Employment/Education Employment/Work Situation: Employment / Work Situation Employment Situation: Unemployed Patient's Job has Been Impacted by Current Illness: No What is the Longest Time Patient  has Held a 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 Where was the Patient Employed at that Time?: his own buisness Has Patient ever Been in the [employer]?: No  Education: Education Is Patient Currently Attending School?: No Last Grade Completed: 10 Did Teacher, adult education From [employer]?: Yes (GED) Did You Attend College?: No Did You Attend Graduate School?: No Did You Have An Individualized Education Program (IIEP): No Did You Have Any Difficulty At School?: No Patient's Education Has Been Impacted by Current Illness: No   CCA Family/Childhood History Family and Relationship History: Family history Marital status: Long term relationship Long term relationship, how long?: 12 years What types of issues is patient dealing with in the relationship?: finicials Are you sexually active?: No What is your sexual orientation?: hetrosexual Does patient have children?: No  Childhood History:  Childhood History By whom was/is the patient raised?: Adoptive parents Additional childhood history information: adopted when he was 82 years old. Bio mother had alchohol problems. She had 5 children and could not take care of any of them. Description of patient's relationship with caregiver when they were a child: fine Patient's description of current relationship with people who raised him/her: dad passed away. Mom good Does patient have siblings?: Yes Number of Siblings: 3 Description of patient's current relationship with siblings: 1/3 he has a good relatioship with Did patient suffer any verbal/emotional/physical/sexual abuse as a child?: No Did patient suffer from severe childhood neglect?: No Has patient ever been sexually abused/assaulted/raped as an adolescent or adult?: No Was the patient ever a victim of a crime or a disaster?: No Witnessed domestic violence?: No Has patient been affected by domestic violence as an adult?: No  Child/Adolescent Assessment:     CCA Substance  Use Alcohol/Drug Use: Alcohol / Drug Use Pain Medications: none reported Prescriptions: Buspar Over the Counter: none reported History of alcohol / drug use?: Yes Negative Consequences of Use: Financial, Legal, Personal relationships Withdrawal Symptoms: Agitation, Aggressive/Assaultive Substance #1 Name of Substance 1: Alcohol 1 - Age of First Use: 17 yro 1 - Amount (size/oz): 12 pack 1 - Frequency: 4 times weekly 1 - Duration: 35 years 1 - Last Use / Amount: Sunday 1 - Method of Aquiring: store 1- Route of Use: oral     ASAM's:  Six Dimensions of Multidimensional Assessment  Dimension 1:  Acute Intoxication and/or Withdrawal Potential:      Dimension 2:  Biomedical Conditions and Complications:      Dimension 3:  Emotional, Behavioral, or Cognitive Conditions and Complications:     Dimension 4:  Readiness to Change:     Dimension 5:  Relapse, Continued use, or Continued Problem Potential:     Dimension 6:  Recovery/Living Environment:     ASAM Severity Score:  ASAM Recommended Level of Treatment: ASAM Recommended Level of Treatment: Level III Residential Treatment   Substance use Disorder (SUD) Substance Use Disorder (SUD)  Checklist Symptoms of Substance Use: Continued use despite having a persistent/recurrent physical/psychological problem caused/exacerbated by use, Continued use despite persistent or recurrent social, interpersonal problems, caused or exacerbated by use, Evidence of tolerance, Large amounts of time spent to obtain, use or recover from the substance(s)  Recommendations for Services/Supports/Treatments: Recommendations for Services/Supports/Treatments Recommendations For Services/Supports/Treatments: Detox  DSM5 Diagnoses: Patient Active Problem List   Diagnosis Date Noted   Alcohol-induced mood disorder ([place]) 04/15/2021   Mixed hyperlipidemia 12/04/2020   Influenza vaccine refused 12/03/2020   Tetanus, diphtheria, and acellular pertussis (Tdap)  vaccination declined 12/03/2020   Essential hypertension 12/03/2020   Alcohol use disorder, severe, dependence ([place]) 12/03/2020   GAD (generalized anxiety disorder) 12/03/2020   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

## 2021-04-29 ENCOUNTER — Other Ambulatory Visit: Payer: Self-pay

## 2021-04-29 ENCOUNTER — Other Ambulatory Visit: Payer: Self-pay | Admitting: Internal Medicine

## 2021-04-29 MED ORDER — AMLODIPINE BESYLATE 10 MG PO TABS
10.0000 mg | ORAL_TABLET | Freq: Every day | ORAL | 2 refills | Status: DC
  Filled 2021-04-29: qty 30, 30d supply, fill #0
  Filled 2021-06-25: qty 30, 30d supply, fill #1
  Filled 2021-07-25: qty 30, 30d supply, fill #2

## 2021-04-29 MED FILL — Buspirone HCl Tab 5 MG: ORAL | 20 days supply | Qty: 60 | Fill #4 | Status: AC

## 2021-04-29 MED FILL — Lisinopril & Hydrochlorothiazide Tab 10-12.5 MG: ORAL | 30 days supply | Qty: 30 | Fill #4 | Status: AC

## 2021-05-30 ENCOUNTER — Other Ambulatory Visit: Payer: Self-pay

## 2021-05-30 ENCOUNTER — Ambulatory Visit: Payer: Self-pay | Attending: Internal Medicine | Admitting: Internal Medicine

## 2021-05-30 ENCOUNTER — Encounter: Payer: Self-pay | Admitting: Internal Medicine

## 2021-05-30 VITALS — BP 106/73 | HR 83 | Resp 16

## 2021-05-30 DIAGNOSIS — Z2821 Immunization not carried out because of patient refusal: Secondary | ICD-10-CM

## 2021-05-30 DIAGNOSIS — G8929 Other chronic pain: Secondary | ICD-10-CM

## 2021-05-30 DIAGNOSIS — M546 Pain in thoracic spine: Secondary | ICD-10-CM

## 2021-05-30 DIAGNOSIS — M79672 Pain in left foot: Secondary | ICD-10-CM | POA: Insufficient documentation

## 2021-05-30 DIAGNOSIS — I10 Essential (primary) hypertension: Secondary | ICD-10-CM

## 2021-05-30 MED ORDER — DICLOFENAC SODIUM 1 % EX GEL
2.0000 g | Freq: Four times a day (QID) | CUTANEOUS | 1 refills | Status: AC
  Filled 2021-05-30: qty 100, 12d supply, fill #0

## 2021-05-30 MED ORDER — BUSPIRONE HCL 5 MG PO TABS
ORAL_TABLET | ORAL | 5 refills | Status: DC
  Filled 2021-05-30: qty 60, 20d supply, fill #0

## 2021-05-30 MED ORDER — LISINOPRIL-HYDROCHLOROTHIAZIDE 10-12.5 MG PO TABS
1.0000 | ORAL_TABLET | Freq: Every day | ORAL | 1 refills | Status: DC
  Filled 2021-05-30: qty 30, 30d supply, fill #0
  Filled 2021-06-25: qty 30, 30d supply, fill #1
  Filled 2021-07-25: qty 30, 30d supply, fill #2
  Filled 2021-09-05: qty 30, 30d supply, fill #3
  Filled 2021-11-12: qty 30, 30d supply, fill #0
  Filled 2022-01-09: qty 30, 30d supply, fill #1
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
## 2021-06-03 ENCOUNTER — Other Ambulatory Visit: Payer: Self-pay

## 2021-06-06 ENCOUNTER — Other Ambulatory Visit: Payer: Self-pay

## 2021-06-17 ENCOUNTER — Other Ambulatory Visit: Payer: Self-pay

## 2021-06-17 ENCOUNTER — Ambulatory Visit (INDEPENDENT_AMBULATORY_CARE_PROVIDER_SITE_OTHER): Payer: No Payment, Other | Admitting: Physician Assistant

## 2021-06-17 ENCOUNTER — Encounter (HOSPITAL_COMMUNITY): Payer: Self-pay | Admitting: Physician Assistant

## 2021-06-17 VITALS — BP 142/92 | HR 87 | Ht 68.0 in | Wt 188.0 lb

## 2021-06-17 DIAGNOSIS — F331 Major depressive disorder, recurrent, moderate: Secondary | ICD-10-CM

## 2021-06-17 DIAGNOSIS — F411 Generalized anxiety disorder: Secondary | ICD-10-CM | POA: Diagnosis not present

## 2021-06-17 DIAGNOSIS — F410 Panic disorder [episodic paroxysmal anxiety] without agoraphobia: Secondary | ICD-10-CM

## 2021-06-17 MED ORDER — HYDROXYZINE HCL 10 MG PO TABS
10.0000 mg | ORAL_TABLET | Freq: Three times a day (TID) | ORAL | 1 refills | Status: DC | PRN
  Filled 2021-06-17 – 2021-06-25 (×2): qty 75, 25d supply, fill #0
  Filled 2021-07-25: qty 75, 25d supply, fill #1

## 2021-06-17 MED ORDER — BUSPIRONE HCL 10 MG PO TABS
10.0000 mg | ORAL_TABLET | Freq: Three times a day (TID) | ORAL | 5 refills | Status: DC
  Filled 2021-06-17 – 2021-06-25 (×2): qty 90, 30d supply, fill #0
  Filled 2021-07-25: qty 90, 30d supply, fill #1
  Filled 2021-09-05: qty 90, 30d supply, fill #2
  Filled 2021-11-12: qty 90, 30d supply, fill #0
  Filled 2022-01-09: qty 90, 30d supply, fill #1
  Filled 2022-02-17: qty 90, 30d supply, fill #2

## 2021-06-17 MED ORDER — ESCITALOPRAM OXALATE 10 MG PO TABS
10.0000 mg | ORAL_TABLET | Freq: Every day | ORAL | 1 refills | Status: DC
  Filled 2021-06-17 – 2021-06-25 (×2): qty 30, 30d supply, fill #0
  Filled 2021-07-25: qty 30, 30d supply, fill #1

## 2021-06-17 NOTE — Progress Notes (Signed)
Psychiatric Initial Adult Assessment   Patient Identification: Cesar Hall MRN:  [id number] Date of Evaluation:  06/17/2021 Referral Source: New patient evaluation/Primary Care Provider Chief Complaint:   Chief Complaint   New Patient (Initial Visit)    Visit Diagnosis:    ICD-10-CM   1. Generalized anxiety disorder with panic attacks  F41.1 hydrOXYzine (ATARAX/VISTARIL) 10 MG tablet   F41.0 escitalopram (LEXAPRO) 10 MG tablet    busPIRone (BUSPAR) 10 MG tablet    2. Moderate episode of recurrent major depressive disorder (HCC)  F33.1 escitalopram (LEXAPRO) 10 MG tablet      History of Present Illness:    Cesar Hall. [place] "Cesar Hall" is a 55 year old male with a past psychiatric history significant for anxiety and panic attacks who presents to [facility] for psychiatric evaluation.  Patient reports that he has had several panic attacks over the past several years.  Patient endorses being hospitalized due to his past panic attacks.  In 2003, patient states that he saw a doctor and was diagnosed with social anxiety.  Patient states that he was placed on medications for the management of his social anxiety but when he lost his job, his insurance ran out and he was unable to afford his medications.  Patient states that he constantly paces and worries all the time.  He states that he is overwhelmed to the point that he is unable to function.  Patient is currently taking buspirone 5 mg 3 times daily.  Patient reports that he has taken the following medications in the past: Klonopin and Wellbutrin.  Patient endorses some anxiety he rates a 10 out of 10.  Patient denies experiencing depressive episodes but states that he does think about death and destruction a lot.  Patient denies past history of suicide attempt or self-harm.  A PHQ-9 screen was performed with the patient scoring a 10.  A GAD-7 screen was also performed the patient scoring a  20.  Patient is alert and oriented x4, calm, cooperative, and fully engaged in conversation during the encounter.  Patient denies suicidal or homicidal ideations.  He further denies auditory or visual hallucinations and does not appear to be responding to internal/external stimuli.  Patient endorses fair sleep and receives on average 6 hours of sleep each night.  Patient endorses excessive alcohol consumption and states that he has roughly 12 beers a day.  Patient endorses tobacco use in the form of dipping.  Patient denies active illicit drug use but states that he has done pretty much every drug in the past.  Patient states that he mainly used marijuana, cocaine, and crack cocaine in the past.  Associated Signs/Symptoms: Depression Symptoms:  depressed mood, insomnia, hypersomnia, psychomotor agitation, feelings of worthlessness/guilt, difficulty concentrating, hopelessness, impaired memory, recurrent thoughts of death, anxiety, panic attacks, disturbed sleep, (Hypo) Manic Symptoms:  Distractibility, Elevated Mood, Flight of Ideas, Grandiosity, Impulsivity, Irritable Mood, Labiality of Mood, Sexually Inapproprite Behavior, Anxiety Symptoms:  Excessive Worry, Panic Symptoms, Social Anxiety, Specific Phobias, Psychotic Symptoms:  Paranoia, PTSD Symptoms: Had a traumatic exposure:  Patient does not endorse any traumatic experiences Had a traumatic exposure in the last month:  N/A Re-experiencing:  Intrusive Thoughts Hypervigilance:  Yes Hyperarousal:  Increased Startle Response Irritability/Anger Avoidance:  Decreased Interest/Participation Foreshortened Future  Past Psychiatric History:  Anxiety  Previous Psychotropic Medications: Yes   Substance Abuse History in the last 12 months:  No.  Consequences of Substance Abuse: Medical Consequences:  None Legal Consequences:  None Family Consequences:  None Blackouts:  Patient endorses that has blacked out in the  past DT's: n/a Withdrawal Symptoms:   None  Past Medical History: History reviewed. No pertinent past medical history.  Past Surgical History:  Procedure Laterality Date   NO PAST SURGERIES      Family Psychiatric History:  Patient states that he was adopted so he is unsure of his family psychiatric history.  He does report that his mother was an alcoholic and lived a wild life.  Family History:  Family History  Adopted: Yes    Social History:   Social History   Socioeconomic History   Marital status: Single    Spouse name: Not on file   Number of children: 0   Years of education: Not on file   Highest education level: GED or equivalent  Occupational History   Not on file  Tobacco Use   Smoking status: Former   Smokeless tobacco: Current    Types: Snuff  Vaping Use   Vaping Use: Never used  Substance and Sexual Activity   Alcohol use: Yes    Alcohol/week: 40.0 standard drinks    Types: 40 Cans of beer per week   Drug use: Never   Sexual activity: Yes  Other Topics Concern   Not on file  Social History Narrative   Not on file   Social Determinants of Health   Financial Resource Strain: High Risk   Difficulty of Paying Living Expenses: Very hard  Food Insecurity: No Food Insecurity   Worried About Charity fundraiser in the Last Year: Never true   Ran Out of Food in the Last Year: Never true  Transportation Needs: Unmet Transportation Needs   Lack of Transportation (Medical): No   Lack of Transportation (Non-Medical): Yes  Physical Activity: Insufficiently Active   Days of Exercise per Week: 5 days   Minutes of Exercise per Session: 20 min  Stress: Stress Concern Present   Feeling of Stress : Very much  Social Connections: Moderately Isolated   Frequency of Communication with Friends and Family: More than three times a week   Frequency of Social Gatherings with Friends and Family: Three times a week   Attends Religious Services: Never   Active Member of  Clubs or Organizations: No   Attends Archivist Meetings: Never   Marital Status: Living with partner    Additional Social History:  Patient is currently living with his girlfriend and states that she pays all the bills.  Patient endorses social support through his mother as well as a friend of his mother's.  Patient states that his mother currently has cancer and is elderly.  He states that he is fearful because he knows that she could die at any time.  Allergies:  No Known Allergies  Metabolic Disorder Labs: No results found for: HGBA1C, MPG No results found for: PROLACTIN Lab Results  Component Value Date   CHOL 245 (H) 12/03/2020   TRIG 105 12/03/2020   HDL 66 12/03/2020   CHOLHDL 3.7 12/03/2020   LDLCALC 161 (H) 12/03/2020   No results found for: TSH  Therapeutic Level Labs: No results found for: LITHIUM No results found for: CBMZ No results found for: VALPROATE  Current Medications: Current Outpatient Medications  Medication Sig Dispense Refill   escitalopram (LEXAPRO) 10 MG tablet Take 1 tablet (10 mg total) by mouth daily. 30 tablet 1   hydrOXYzine (ATARAX/VISTARIL) 10 MG tablet Take 1 tablet (10 mg total) by mouth 3 (three) times  daily as needed. 75 tablet 1   amLODipine (NORVASC) 10 MG tablet Take 1 tablet (10 mg total) by mouth daily. 30 tablet 2   amoxicillin (AMOXIL) 500 MG capsule TAKE 1 CAPSULE BY MOUTH EVERY 8 HOURS UNTIL FINISH (Patient not taking: Reported on 05/30/2021) 21 capsule 0   amoxicillin (AMOXIL) 500 MG tablet Take 1 tablet every 8 hours until finish (Patient not taking: Reported on 05/30/2021) 30 tablet 0   busPIRone (BUSPAR) 10 MG tablet Take 1 tablet (10 mg total) by mouth 3 (three) times daily. 90 tablet 5   diclofenac Sodium (VOLTAREN) 1 % GEL Apply 2 g topically 4 (four) times daily. 100 g 1   ibuprofen (ADVIL) 800 MG tablet Take 1 tablet every 8 hours as needed for pain (Patient not taking: Reported on 05/30/2021) 15 tablet 1    lisinopril-hydrochlorothiazide (ZESTORETIC) 10-12.5 MG tablet TAKE 1 TABLET BY MOUTH DAILY. 90 tablet 1   lisinopril-hydrochlorothiazide (ZESTORETIC) 10-12.5 MG tablet Take 1 tablet by mouth daily. 90 tablet 1   No current facility-administered medications for this visit.    Musculoskeletal: Strength & Muscle Tone: within normal limits Gait & Station: normal Patient leans: N/A  Psychiatric Specialty Exam: Review of Systems  Psychiatric/Behavioral:  Positive for agitation, decreased concentration and sleep disturbance. Negative for dysphoric mood, hallucinations, self-injury and suicidal ideas. The patient is nervous/anxious. The patient is not hyperactive.    Blood pressure (!) 142/92, pulse 87, height 5\' 8"  (1.727 m), weight 188 lb (85.3 kg).Body mass index is 28.59 kg/m.  General Appearance: Well Groomed  Eye Contact:  Good  Speech:  Clear and Coherent and Normal Rate  Volume:  Normal  Mood:  Anxious, Depressed, and Irritable  Affect:  Congruent and Depressed  Thought Process:  Coherent, Goal Directed, and Descriptions of Associations: Intact  Orientation:  Full (Time, Place, and Person)  Thought Content:  WDL  Suicidal Thoughts:  No  Homicidal Thoughts:  No  Memory:  Immediate;   Good Recent;   Good Remote;   Good  Judgement:  Fair  Insight:  Fair  Psychomotor Activity:  Normal  Concentration:  Concentration: Good and Attention Span: Good  Recall:  Good  Fund of Knowledge:Fair  Language: Good  Akathisia:  NA  Handed:  Right  AIMS (if indicated):  not done  Assets:  Communication Skills Desire for Improvement Housing Social Support  ADL's:  Intact  Cognition: WNL  Sleep:  Fair   Screenings: AUDIT    Health and safety inspector from 04/15/2021 in [facility]  Alcohol Use Disorder Identification Test Final Score (AUDIT) 28      GAD-7    Flowsheet Row Office Visit from 06/17/2021 in [facility] Office Visit  from 05/30/2021 in [place] Counselor from 04/15/2021 in [facility] Office Visit from 01/27/2021 in [place] Office Visit from 12/03/2020 in [place]  Total GAD-7 Score 20 15 16 7 18       PHQ2-9    [place] Office Visit from 06/17/2021 in [facility] Office Visit from 05/30/2021 in [place] Counselor from 04/15/2021 in [facility] Office Visit from 01/27/2021 in [place] Visit from 12/03/2020 in [place]  PHQ-2 Total Score 2 0 2 0 0  PHQ-9 Total Score 10 -- 14 -- --  [place] Office Visit from 06/17/2021 in [facility] Counselor from 04/15/2021 in [place] CATEGORY Low Risk Low Risk       Assessment and Plan:   Cesar Magnussen. [place] "Cesar Hall" is a 55 year old male with a past psychiatric history significant for anxiety and panic attacks who presents to [facility] for psychiatric evaluation.  Patient presents today dealing with anxiety and panic attacks.  Patient is currently taking buspirone 5 mg 3 times daily for the management of his anxiety.  Patient was recommended hydroxyzine 10 mg 3 times daily as needed for the management of his anxiety.  Patient was also recommended Lexapro 10 mg daily for the management of mild depressive episodes, anxiety, and panic disorder.  Patient was agreeable to recommendation.  Patient's medications to be e-prescribed to pharmacy of choice.  1. Generalized anxiety disorder with panic attacks  - hydrOXYzine (ATARAX/VISTARIL) 10 MG tablet; Take 1 tablet (10 mg total) by mouth 3 (three) times daily as needed.  Dispense: 75 tablet; Refill: 1 - escitalopram (LEXAPRO) 10  MG tablet; Take 1 tablet (10 mg total) by mouth daily.  Dispense: 30 tablet; Refill: 1 - busPIRone (BUSPAR) 10 MG tablet; Take 1 tablet (10 mg total) by mouth 3 (three) times daily.  Dispense: 90 tablet; Refill: 5  2. Moderate episode of recurrent major depressive disorder (HCC)  - escitalopram (LEXAPRO) 10 MG tablet; Take 1 tablet (10 mg total) by mouth daily.  Dispense: 30 tablet; Refill: 1  Patient to follow up in 6 weeks Provider spent a total of 35 minutes with the patient/reviewing patient's chart  Malachy Mood, PA 9/20/20226:40 PM

## 2021-06-24 ENCOUNTER — Other Ambulatory Visit: Payer: Self-pay

## 2021-06-25 ENCOUNTER — Other Ambulatory Visit: Payer: Self-pay

## 2021-07-25 ENCOUNTER — Other Ambulatory Visit: Payer: Self-pay

## 2021-07-31 ENCOUNTER — Encounter (HOSPITAL_COMMUNITY): Payer: No Payment, Other | Admitting: Physician Assistant

## 2021-09-05 ENCOUNTER — Other Ambulatory Visit (HOSPITAL_COMMUNITY): Payer: Self-pay | Admitting: Physician Assistant

## 2021-09-05 ENCOUNTER — Other Ambulatory Visit: Payer: Self-pay | Admitting: Internal Medicine

## 2021-09-05 ENCOUNTER — Other Ambulatory Visit: Payer: Self-pay

## 2021-09-05 DIAGNOSIS — F331 Major depressive disorder, recurrent, moderate: Secondary | ICD-10-CM

## 2021-09-05 DIAGNOSIS — F411 Generalized anxiety disorder: Secondary | ICD-10-CM

## 2021-09-05 DIAGNOSIS — F410 Panic disorder [episodic paroxysmal anxiety] without agoraphobia: Secondary | ICD-10-CM

## 2021-09-05 MED ORDER — AMLODIPINE BESYLATE 10 MG PO TABS
10.0000 mg | ORAL_TABLET | Freq: Every day | ORAL | 1 refills | Status: DC
  Filled 2021-09-05 – 2021-11-12 (×2): qty 30, 30d supply, fill #0

## 2021-09-08 ENCOUNTER — Other Ambulatory Visit: Payer: Self-pay

## 2021-09-10 ENCOUNTER — Other Ambulatory Visit: Payer: Self-pay

## 2021-09-10 MED ORDER — ESCITALOPRAM OXALATE 10 MG PO TABS
10.0000 mg | ORAL_TABLET | Freq: Every day | ORAL | 1 refills | Status: DC
Start: 2021-09-10 — End: 2022-05-01
  Filled 2021-09-10 – 2021-11-12 (×2): qty 30, 30d supply, fill #0

## 2021-09-10 MED ORDER — HYDROXYZINE HCL 10 MG PO TABS
10.0000 mg | ORAL_TABLET | Freq: Three times a day (TID) | ORAL | 1 refills | Status: DC | PRN
Start: 2021-09-10 — End: 2022-05-01
  Filled 2021-09-10 – 2021-11-12 (×2): qty 75, 25d supply, fill #0

## 2021-11-12 ENCOUNTER — Other Ambulatory Visit: Payer: Self-pay

## 2021-11-18 ENCOUNTER — Other Ambulatory Visit: Payer: Self-pay

## 2022-01-09 ENCOUNTER — Other Ambulatory Visit: Payer: Self-pay | Admitting: Internal Medicine

## 2022-01-09 ENCOUNTER — Other Ambulatory Visit: Payer: Self-pay

## 2022-01-09 ENCOUNTER — Other Ambulatory Visit (HOSPITAL_COMMUNITY): Payer: Self-pay | Admitting: Physician Assistant

## 2022-01-09 DIAGNOSIS — F410 Panic disorder [episodic paroxysmal anxiety] without agoraphobia: Secondary | ICD-10-CM

## 2022-01-09 DIAGNOSIS — F331 Major depressive disorder, recurrent, moderate: Secondary | ICD-10-CM

## 2022-01-09 MED ORDER — AMLODIPINE BESYLATE 10 MG PO TABS
10.0000 mg | ORAL_TABLET | Freq: Every day | ORAL | 0 refills | Status: DC
  Filled 2022-01-09: qty 30, 30d supply, fill #0

## 2022-01-09 NOTE — Telephone Encounter (Signed)
Thank you :)

## 2022-01-09 NOTE — Telephone Encounter (Signed)
Patient must be provided a follow up appointment in order for medications to be prescribed. Patient was last seen on 06/17/2021. Patient No Showed in November.

## 2022-01-15 ENCOUNTER — Other Ambulatory Visit: Payer: Self-pay

## 2022-02-17 ENCOUNTER — Other Ambulatory Visit: Payer: Self-pay | Admitting: Internal Medicine

## 2022-02-17 ENCOUNTER — Other Ambulatory Visit: Payer: Self-pay

## 2022-02-17 DIAGNOSIS — I10 Essential (primary) hypertension: Secondary | ICD-10-CM

## 2022-02-18 ENCOUNTER — Other Ambulatory Visit: Payer: Self-pay

## 2022-02-18 MED ORDER — LISINOPRIL-HYDROCHLOROTHIAZIDE 10-12.5 MG PO TABS
1.0000 | ORAL_TABLET | Freq: Every day | ORAL | 0 refills | Status: DC
  Filled 2022-02-18: qty 30, 30d supply, fill #0

## 2022-02-18 MED ORDER — AMLODIPINE BESYLATE 10 MG PO TABS
10.0000 mg | ORAL_TABLET | Freq: Every day | ORAL | 0 refills | Status: DC
  Filled 2022-02-18: qty 30, 30d supply, fill #0

## 2022-02-18 NOTE — Telephone Encounter (Signed)
Requested medication (s) are due for refill today: Yes  Requested medication (s) are on the active medication list: Yes  Last refill:    Future visit scheduled: No  Notes to clinic:  Left message to call and make appointment. Courtesy refill already given.    Requested Prescriptions  Pending Prescriptions Disp Refills   lisinopril-hydrochlorothiazide (ZESTORETIC) 10-12.5 MG tablet 90 tablet 1    Sig: Take 1 tablet by mouth daily.     Cardiovascular:  ACEI + Diuretic Combos Failed - 02/17/2022  1:52 PM      Failed - Na in normal range and within 180 days    Sodium  Date Value Ref Range Status  12/03/2020 141 134 - 144 mmol/L Final         Failed - K in normal range and within 180 days    Potassium  Date Value Ref Range Status  12/03/2020 4.8 3.5 - 5.2 mmol/L Final         Failed - Cr in normal range and within 180 days    Creatinine, Ser  Date Value Ref Range Status  12/03/2020 0.92 0.76 - 1.27 mg/dL Final         Failed - eGFR is 30 or above and within 180 days    eGFR  Date Value Ref Range Status  12/03/2020 100 >59 mL/min/1.73 Final         Failed - Last BP in normal range    BP Readings from Last 1 Encounters:  05/30/21 106/73         Failed - Valid encounter within last 6 months    Recent Outpatient Visits           8 months ago Chronic midline thoracic back pain   [place], Deborah B, MD   1 year ago Essential hypertension   [place], Deborah B, MD   1 year ago Essential hypertension   [place], Jarome Matin, RPH-CPP   1 year ago Essential hypertension   [place], RPH-CPP   1 year ago Establishing care with new doctor, encounter for   [place], MD               Passed - Patient is not pregnant       amLODipine  (NORVASC) 10 MG tablet 30 tablet 0    Sig: Take 1 tablet (10 mg total) by mouth daily.     Cardiovascular: Calcium Channel Blockers 2 Failed - 02/17/2022  1:52 PM      Failed - Last BP in normal range    BP Readings from Last 1 Encounters:  05/30/21 106/73         Failed - Valid encounter within last 6 months    Recent Outpatient Visits           8 months ago Chronic midline thoracic back pain   [place], Deborah B, MD   1 year ago Essential hypertension   [place], Deborah B, MD   1 year ago Essential hypertension   [place], Stephen L, RPH-CPP   1 year ago Essential hypertension   [place], RPH-CPP   1 year ago Establishing care  with new doctor, encounter for   [place], MD               Passed - Last Heart Rate in normal range    Pulse Readings from Last 1 Encounters:  05/30/21 83

## 2022-02-19 ENCOUNTER — Other Ambulatory Visit: Payer: Self-pay

## 2022-04-07 ENCOUNTER — Other Ambulatory Visit: Payer: Self-pay | Admitting: Internal Medicine

## 2022-04-07 ENCOUNTER — Other Ambulatory Visit (HOSPITAL_COMMUNITY): Payer: Self-pay | Admitting: Physician Assistant

## 2022-04-07 ENCOUNTER — Other Ambulatory Visit: Payer: Self-pay

## 2022-04-07 DIAGNOSIS — I10 Essential (primary) hypertension: Secondary | ICD-10-CM

## 2022-04-07 DIAGNOSIS — F410 Panic disorder [episodic paroxysmal anxiety] without agoraphobia: Secondary | ICD-10-CM

## 2022-04-08 ENCOUNTER — Other Ambulatory Visit: Payer: Self-pay

## 2022-04-09 ENCOUNTER — Other Ambulatory Visit: Payer: Self-pay

## 2022-04-09 ENCOUNTER — Other Ambulatory Visit: Payer: Self-pay | Admitting: Pharmacist

## 2022-04-09 DIAGNOSIS — I10 Essential (primary) hypertension: Secondary | ICD-10-CM

## 2022-04-09 MED ORDER — AMLODIPINE BESYLATE 10 MG PO TABS
10.0000 mg | ORAL_TABLET | Freq: Every day | ORAL | 0 refills | Status: DC
  Filled 2022-04-09: qty 30, 30d supply, fill #0

## 2022-04-09 MED ORDER — LISINOPRIL-HYDROCHLOROTHIAZIDE 10-12.5 MG PO TABS
1.0000 | ORAL_TABLET | Freq: Every day | ORAL | 0 refills | Status: DC
  Filled 2022-04-09: qty 30, 30d supply, fill #0

## 2022-04-10 ENCOUNTER — Other Ambulatory Visit: Payer: Self-pay

## 2022-05-01 ENCOUNTER — Ambulatory Visit: Payer: Self-pay | Attending: Internal Medicine | Admitting: Internal Medicine

## 2022-05-01 ENCOUNTER — Encounter: Payer: Self-pay | Admitting: Internal Medicine

## 2022-05-01 ENCOUNTER — Other Ambulatory Visit: Payer: Self-pay

## 2022-05-01 VITALS — BP 123/68 | HR 87 | Ht 67.0 in | Wt 193.6 lb

## 2022-05-01 DIAGNOSIS — I10 Essential (primary) hypertension: Secondary | ICD-10-CM

## 2022-05-01 DIAGNOSIS — Z1211 Encounter for screening for malignant neoplasm of colon: Secondary | ICD-10-CM

## 2022-05-01 DIAGNOSIS — F1020 Alcohol dependence, uncomplicated: Secondary | ICD-10-CM

## 2022-05-01 DIAGNOSIS — Z720 Tobacco use: Secondary | ICD-10-CM

## 2022-05-01 DIAGNOSIS — F411 Generalized anxiety disorder: Secondary | ICD-10-CM

## 2022-05-01 MED ORDER — LISINOPRIL-HYDROCHLOROTHIAZIDE 10-12.5 MG PO TABS
1.0000 | ORAL_TABLET | Freq: Every day | ORAL | 6 refills | Status: AC
  Filled 2022-05-01 – 2022-05-11 (×2): qty 30, 30d supply, fill #0
  Filled 2022-06-29: qty 30, 30d supply, fill #1

## 2022-05-01 MED ORDER — AMLODIPINE BESYLATE 10 MG PO TABS
10.0000 mg | ORAL_TABLET | Freq: Every day | ORAL | 6 refills | Status: AC
  Filled 2022-05-01 – 2022-05-11 (×2): qty 30, 30d supply, fill #0
  Filled 2022-06-29: qty 30, 30d supply, fill #1

## 2022-05-01 MED ORDER — ESCITALOPRAM OXALATE 10 MG PO TABS
10.0000 mg | ORAL_TABLET | Freq: Every day | ORAL | 6 refills | Status: AC
  Filled 2022-05-01 – 2022-05-11 (×2): qty 30, 30d supply, fill #0
  Filled 2022-06-29: qty 30, 30d supply, fill #1

## 2022-05-01 MED ORDER — BUSPIRONE HCL 10 MG PO TABS
10.0000 mg | ORAL_TABLET | Freq: Three times a day (TID) | ORAL | 5 refills | Status: AC
  Filled 2022-05-01 – 2022-05-11 (×2): qty 90, 30d supply, fill #0
  Filled 2022-06-29: qty 90, 30d supply, fill #1

## 2022-05-01 NOTE — Progress Notes (Signed)
Patient ID: Cesar Hall, male    DOB: May 30, 1966  MRN: [id number]  CC: Chronic disease management.  Subjective: Cesar Hall is a 56 y.o. male who presents for chronic disease management.   His concerns today include:  Patient with history of HTN, HL,  binge drinking, tob dep, anx ds  HTN:  needs RF on Lis/HCTZ 10/12.5 mg and Norvasc 10 mg Tries to limit salt in foods No CP/SOB  Anx:  saw PA at [facility] 05/2021 and did not go back for f/u because he though he needed to have OC renewed.  Out of Buspar for few mths.  Out of Lexapro and Hydroxyzine x 6 mths.  Was on Xanax in past and felt it worked well.  Wanting to know if I would prescribe it for him.  Tob:  dips snuff  ETOH Use:  drinks 12 pk a day.  "I don't get drunk or get in trouble or commit crimes.  I'm just killing myself slowly.  I just enjoy it."  Denies any street drug use.  HM:  due for colon CA screen, Tdapt and Shingrix.  Agrees to receiving Tdap and Shingrix but not today because he is in a rush to meet his sister.  Agrees to come back to see the clinical pharmacist to have these vaccines.  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 100 g 1   ibuprofen (ADVIL) 800 MG tablet Take 1 tablet every 8 hours as needed for pain 15 tablet 1   No current facility-administered medications on file prior to visit.    No Known Allergies  Social History   Socioeconomic History   Marital status: Single    Spouse name: Not on file   Number of children: 0   Years of education: Not on file   Highest education level: GED or equivalent  Occupational History   Not on file  Tobacco Use   Smoking status: Former   Smokeless tobacco: Current    Types: Snuff  Vaping Use   Vaping Use: Never used  Substance and Sexual Activity   Alcohol use: Yes    Alcohol/week: 40.0 standard drinks of alcohol    Types: 40 Cans of beer per week   Drug use: Never   Sexual activity: Yes  Other Topics Concern   Not on file  Social History Narrative   Not on file   Social Determinants of Health   Financial Resource Strain: High Risk (04/15/2021)   Overall Financial Resource Strain (CARDIA)    Difficulty of Paying Living Expenses: Very hard  Food Insecurity: No Food Insecurity (04/15/2021)   Hunger Vital Sign    Worried  About Running Out of Food in the Last Year: Never true    Ran Out of Food in the Last Year: Never true  Transportation Needs: Unmet Transportation Needs (04/15/2021)   PRAPARE - Transportation    Lack of Transportation (Medical): No    Lack of Transportation (Non-Medical): Yes  Physical Activity: Insufficiently Active (04/15/2021)   Exercise Vital Sign    Days of Exercise per Week: 5 days    Minutes of Exercise per Session: 20 min  Stress: Stress Concern Present (04/15/2021)   [place]    Feeling of Stress : Very much  Social Connections: Moderately Isolated (04/15/2021)   Social Connection and Isolation Panel [NHANES]    Frequency of Communication with Friends and Family:  More than three times a week    Frequency of Social Gatherings with Friends and Family: Three times a week    Attends Religious Services: Never    Active Member of Clubs or Organizations: No    Attends Archivist Meetings: Never    Marital Status: Living with partner  Intimate Partner Violence: Not At Risk (04/15/2021)   Humiliation, Afraid, Rape, and Kick questionnaire    Fear of Current or Ex-Partner: No    Emotionally Abused: No    Physically Abused: No    Sexually Abused: No    Family History  Adopted: Yes    Past Surgical History:  Procedure Laterality Date   NO PAST SURGERIES      ROS: Review of Systems Negative except as stated above  PHYSICAL EXAM: BP 123/68   Pulse 87   Ht '5\' 7"'$  (1.702 m)   Wt 193 lb 9.6 oz (87.8 kg)   SpO2 95%   BMI 30.32 kg/m   Physical Exam  General appearance - alert, well appearing, Caucasian male who looks older than stated age and in no distress Mental status - normal mood, behavior, speech, dress, motor activity, and thought processes Chest - clear to auscultation, no wheezes, rales or rhonchi, symmetric air entry Heart - normal rate, regular rhythm, normal S1, S2, no murmurs, rubs, clicks or gallops Extremities - peripheral pulses normal, no pedal edema, no clubbing or cyanosis      Latest Ref Rng & Units 12/03/2020   10:54 AM  CMP  Glucose 65 - 99 mg/dL 108   BUN 6 - 24 mg/dL 17   Creatinine 0.76 - 1.27 mg/dL 0.92   Sodium 134 - 144 mmol/L 141   Potassium 3.5 - 5.2 mmol/L 4.8   Chloride 96 - 106 mmol/L 100   CO2 20 - 29 mmol/L 24   Calcium 8.7 - 10.2 mg/dL 9.6   Total Protein 6.0 - 8.5 g/dL 8.4   Total Bilirubin 0.0 - 1.2 mg/dL 0.4   Alkaline Phos 44 - 121 IU/L 72   AST 0 - 40 IU/L 49   ALT 0 - 44 IU/L 72    Lipid Panel     Component Value Date/Time   CHOL 245 (H) 12/03/2020 1054   TRIG 105 12/03/2020 1054   HDL 66 12/03/2020 1054   CHOLHDL 3.7 12/03/2020 1054   LDLCALC 161 (H) 12/03/2020 1054    CBC     Component Value Date/Time   WBC 9.3 12/03/2020 1054   RBC 5.09 12/03/2020 1054   HGB 15.4 12/03/2020 1054   HCT 46.7 12/03/2020 1054   PLT 239 12/03/2020 1054   MCV 92 12/03/2020 1054   MCH 30.3 12/03/2020 1054  MCHC 33.0 12/03/2020 1054   RDW 12.5 12/03/2020 1054    ASSESSMENT AND PLAN: 1. Essential hypertension At goal.  Continue lisinopril/HCTZ 10/12.5 and Norvasc 10 mg daily. - CBC - Comprehensive metabolic panel - Lipid panel - lisinopril-hydrochlorothiazide (ZESTORETIC) 10-12.5 MG tablet; Take 1 tablet by mouth daily.  Dispense: 30 tablet; Refill: 6  2. Alcohol use disorder, severe, dependence ([place]) Discussed health risks associated with excessive alcohol use. Patient not willing to quit.  Encouraged him to try to cut back but he is not interested in that either.  3. Snuff user Discouraged use as use of any tobacco product is not good.  4. GAD (generalized anxiety disorder) We agreed to put him back on BuSpar and Lexapro - busPIRone (BUSPAR) 10 MG tablet; Take 1 tablet (10 mg total) by mouth 3 (three) times daily.  Dispense: 90 tablet; Refill: 5 - escitalopram (LEXAPRO) 10 MG tablet; Take 1 tablet (10 mg total) by mouth daily.  Dispense: 30 tablet; Refill: 6  5. Screening for colon cancer Patient willing to do fit test for colon cancer screening. - Fecal occult blood, imunochemical([facility])     Patient was given the opportunity to ask questions.  Patient verbalized understanding of the plan and was able to repeat key elements of the plan.   This documentation was completed using Radio producer.  Any transcriptional errors are unintentional.  Orders Placed This Encounter  Procedures   Fecal occult blood, imunochemical([facility])   CBC   Comprehensive metabolic panel   Lipid panel     Requested Prescriptions   Signed Prescriptions Disp Refills   busPIRone (BUSPAR) 10 MG tablet 90 tablet 5    Sig: Take 1 tablet (10 mg  total) by mouth 3 (three) times daily.   escitalopram (LEXAPRO) 10 MG tablet 30 tablet 6    Sig: Take 1 tablet (10 mg total) by mouth daily.   amLODipine (NORVASC) 10 MG tablet 30 tablet 6    Sig: Take 1 tablet (10 mg total) by mouth daily.   lisinopril-hydrochlorothiazide (ZESTORETIC) 10-12.5 MG tablet 30 tablet 6    Sig: Take 1 tablet by mouth daily.    Return in about 4 months (around 08/31/2022) for give appt with [facility] in 2 wks for Tdapt and Shingles vaccines.  Karle Plumber, MD, FACP

## 2022-05-01 NOTE — Progress Notes (Signed)
Pt needs RF on BP Rx & Buspirone

## 2022-05-02 ENCOUNTER — Telehealth: Payer: Self-pay | Admitting: Internal Medicine

## 2022-05-02 LAB — CBC
Hematocrit: 39.9 % (ref 37.5–51.0)
Hemoglobin: 14.3 g/dL (ref 13.0–17.7)
MCH: 31.2 pg (ref 26.6–33.0)
MCHC: 35.8 g/dL — ABNORMAL HIGH (ref 31.5–35.7)
MCV: 87 fL (ref 79–97)
Platelets: 196 10*3/uL (ref 150–450)
RBC: 4.59 x10E6/uL (ref 4.14–5.80)
RDW: 12.2 % (ref 11.6–15.4)
WBC: 6.1 10*3/uL (ref 3.4–10.8)

## 2022-05-02 LAB — COMPREHENSIVE METABOLIC PANEL
ALT: 155 IU/L — ABNORMAL HIGH (ref 0–44)
AST: 165 IU/L — ABNORMAL HIGH (ref 0–40)
Albumin/Globulin Ratio: 1.1 — ABNORMAL LOW (ref 1.2–2.2)
Albumin: 4.4 g/dL (ref 3.8–4.9)
Alkaline Phosphatase: 75 IU/L (ref 44–121)
BUN/Creatinine Ratio: 9 (ref 9–20)
BUN: 7 mg/dL (ref 6–24)
Bilirubin Total: 0.5 mg/dL (ref 0.0–1.2)
CO2: 25 mmol/L (ref 20–29)
Calcium: 9.4 mg/dL (ref 8.7–10.2)
Chloride: 91 mmol/L — ABNORMAL LOW (ref 96–106)
Creatinine, Ser: 0.82 mg/dL (ref 0.76–1.27)
Globulin, Total: 3.9 g/dL (ref 1.5–4.5)
Glucose: 121 mg/dL — ABNORMAL HIGH (ref 70–99)
Potassium: 3.7 mmol/L (ref 3.5–5.2)
Sodium: 135 mmol/L (ref 134–144)
Total Protein: 8.3 g/dL (ref 6.0–8.5)
eGFR: 105 mL/min/{1.73_m2} (ref >59)

## 2022-05-02 LAB — LIPID PANEL
Chol/HDL Ratio: 3.5 ratio (ref 0.0–5.0)
Cholesterol, Total: 212 mg/dL — ABNORMAL HIGH (ref 100–199)
HDL: 60 mg/dL (ref >39)
LDL Chol Calc (NIH): 133 mg/dL — ABNORMAL HIGH (ref 0–99)
Triglycerides: 106 mg/dL (ref 0–149)
VLDL Cholesterol Cal: 19 mg/dL (ref 5–40)

## 2022-05-02 NOTE — Telephone Encounter (Signed)
Phone call placed to patient this morning x 2 to go over the results of the lab tests.  I received a recording both times stating that call cannot be completed at this time; try the call again later.

## 2022-05-07 ENCOUNTER — Other Ambulatory Visit: Payer: Self-pay

## 2022-05-11 ENCOUNTER — Other Ambulatory Visit: Payer: Self-pay

## 2022-05-18 ENCOUNTER — Telehealth: Payer: Self-pay | Admitting: Internal Medicine

## 2022-05-18 NOTE — Telephone Encounter (Signed)
Patient called in stating he was hoping to get his lab results. Please assist patient further

## 2022-05-18 NOTE — Telephone Encounter (Signed)
Pt given lab results per notes of Dr. Wynetta Emery on 05/02/22. Pt verbalized understanding.     Cesar Pier, MD  05/02/2022  9:55 AM EDT     Let patient know that his blood cell counts are normal. Kidney function normal. Liver function tests are abnormal.  Strongly advised that he cut back on alcoholic beverages to prevent further damage to the liver. Cholesterol level mildly elevated but improved from 1 year ago.

## 2022-06-08 ENCOUNTER — Ambulatory Visit: Payer: Self-pay | Admitting: Pharmacist

## 2022-06-29 ENCOUNTER — Other Ambulatory Visit: Payer: Self-pay

## 2022-06-30 ENCOUNTER — Other Ambulatory Visit: Payer: Self-pay

## 2022-07-06 ENCOUNTER — Ambulatory Visit: Payer: Self-pay | Admitting: Pharmacist

## 2022-07-15 ENCOUNTER — Telehealth: Payer: Self-pay | Admitting: Internal Medicine

## 2022-07-15 NOTE — Telephone Encounter (Signed)
I received notification from [place] daily for requesting that I complete the patient's death certificate.  Patient died August 08, 2022.  I called the patient's cell phone number.  Related by the name of [facility] answered.  She identified herself as his girlfriend.  They live together.  Patient had very slurred speech and it was a difficult time understanding her.  What I gleaned from her was that the patient was watching TV, he got up to use the bathroom and fell.  He was able to crawl back into bed.  He started snoring really hard.  She checked on him later and he was dead.  I tried to find out whether he had hit his head and had a difficult time understanding her answer because her speech was slurred.  I asked whether the patient was intoxicated and she said yes.  I asked whether there was some of the family member that I could talk to to get further information.  She provided me with the phone number to the patient's sister Earnestine Mealing. According to Abigail Butts, she was called around 2 p.m and told that the patient was deceased.  She states that she got very little information other than he got up to use the bathroom and fell.  He was able to crawl back into bed.  His girlfriend states that he was snoring.  She checked on him later and he was dead.  The girlfriend called his boss.  EMS arrived.  Patient was pronounced dead at home.  She told me that his body was at [facility] in [place].  She tells me that the patient was a severe alcoholic and probably drank himself to death.  I then called that funeral home and spoke with [employer].  She told me that the patient was a nondistended at 13:15 p.m and his home on 08-08-2022.  The case was not turned over to the medical examiner.  However she stated that the medical examiner will come over to take a look at his body because he is being cremated.  She states as far as she can tell when he came in there were no abrasions or lacerations or bruises to the head.

## 2022-08-31 ENCOUNTER — Ambulatory Visit: Payer: Self-pay | Admitting: Internal Medicine
